# Patient Record
Sex: Male | Born: 1960 | Race: White | Hispanic: No | Marital: Single | Smoking: Current every day smoker
Health system: Southern US, Community
[De-identification: ages and names within clinical notes are randomized; demographics above are authoritative.]

## PROBLEM LIST (undated history)

## (undated) DIAGNOSIS — M1990 Unspecified osteoarthritis, unspecified site: Secondary | ICD-10-CM

## (undated) DIAGNOSIS — I10 Essential (primary) hypertension: Secondary | ICD-10-CM

## (undated) HISTORY — PX: MUSCLE BIOPSY: SHX716

## (undated) HISTORY — PX: SINUS SURGERY WITH INSTATRAK: SHX5215

---

## 2002-08-30 ENCOUNTER — Emergency Department (HOSPITAL_COMMUNITY): Admission: EM | Admit: 2002-08-30 | Discharge: 2002-08-30 | Payer: Self-pay | Admitting: Emergency Medicine

## 2002-08-30 ENCOUNTER — Encounter: Payer: Self-pay | Admitting: Emergency Medicine

## 2002-10-18 ENCOUNTER — Encounter: Admission: RE | Admit: 2002-10-18 | Discharge: 2002-11-22 | Payer: Self-pay | Admitting: Orthopedic Surgery

## 2003-10-13 ENCOUNTER — Emergency Department (HOSPITAL_COMMUNITY): Admission: EM | Admit: 2003-10-13 | Discharge: 2003-10-13 | Payer: Self-pay | Admitting: Emergency Medicine

## 2014-08-25 ENCOUNTER — Encounter (HOSPITAL_COMMUNITY): Payer: Self-pay | Admitting: *Deleted

## 2014-08-25 ENCOUNTER — Emergency Department (HOSPITAL_COMMUNITY): Payer: Self-pay

## 2014-08-25 ENCOUNTER — Emergency Department (HOSPITAL_COMMUNITY)
Admission: EM | Admit: 2014-08-25 | Discharge: 2014-08-25 | Disposition: A | Discharge status: Home / Self Care | Payer: Self-pay | Attending: Emergency Medicine | Admitting: Emergency Medicine

## 2014-08-25 DIAGNOSIS — M79669 Pain in unspecified lower leg: Secondary | ICD-10-CM

## 2014-08-25 DIAGNOSIS — E669 Obesity, unspecified: Secondary | ICD-10-CM | POA: Insufficient documentation

## 2014-08-25 DIAGNOSIS — M1990 Unspecified osteoarthritis, unspecified site: Secondary | ICD-10-CM | POA: Insufficient documentation

## 2014-08-25 DIAGNOSIS — Z720 Tobacco use: Secondary | ICD-10-CM | POA: Insufficient documentation

## 2014-08-25 DIAGNOSIS — I10 Essential (primary) hypertension: Secondary | ICD-10-CM | POA: Insufficient documentation

## 2014-08-25 DIAGNOSIS — M5431 Sciatica, right side: Secondary | ICD-10-CM | POA: Insufficient documentation

## 2014-08-25 HISTORY — DX: Essential (primary) hypertension: I10

## 2014-08-25 HISTORY — DX: Unspecified osteoarthritis, unspecified site: M19.90

## 2014-08-25 LAB — CBC WITH DIFFERENTIAL/PLATELET
Basophils Absolute: 0 10*3/uL (ref 0.0–0.1)
Basophils Relative: 0 % (ref 0–1)
EOS ABS: 0.1 10*3/uL (ref 0.0–0.7)
Eosinophils Relative: 1 % (ref 0–5)
HCT: 42.3 % (ref 39.0–52.0)
Hemoglobin: 14.3 g/dL (ref 13.0–17.0)
Lymphocytes Relative: 29 % (ref 12–46)
Lymphs Abs: 2.7 10*3/uL (ref 0.7–4.0)
MCH: 32.3 pg (ref 26.0–34.0)
MCHC: 33.8 g/dL (ref 30.0–36.0)
MCV: 95.5 fL (ref 78.0–100.0)
MONOS PCT: 6 % (ref 3–12)
Monocytes Absolute: 0.6 10*3/uL (ref 0.1–1.0)
NEUTROS PCT: 64 % (ref 43–77)
Neutro Abs: 5.7 10*3/uL (ref 1.7–7.7)
PLATELETS: 293 10*3/uL (ref 150–400)
RBC: 4.43 MIL/uL (ref 4.22–5.81)
RDW: 14 % (ref 11.5–15.5)
WBC: 9.1 10*3/uL (ref 4.0–10.5)

## 2014-08-25 LAB — BASIC METABOLIC PANEL
Anion gap: 10 (ref 5–15)
BUN: 7 mg/dL (ref 6–20)
CHLORIDE: 105 mmol/L (ref 101–111)
CO2: 23 mmol/L (ref 22–32)
Calcium: 8.8 mg/dL — ABNORMAL LOW (ref 8.9–10.3)
Creatinine, Ser: 0.7 mg/dL (ref 0.61–1.24)
GFR calc Af Amer: >60 mL/min (ref >60)
GLUCOSE: 86 mg/dL (ref 65–99)
POTASSIUM: 3.9 mmol/L (ref 3.5–5.1)
SODIUM: 138 mmol/L (ref 135–145)

## 2014-08-25 LAB — D-DIMER, QUANTITATIVE: D-Dimer, Quant: 0.47 ug/mL-FEU (ref 0.00–0.48)

## 2014-08-25 MED ORDER — SODIUM CHLORIDE 0.9 % IV SOLN: INTRAVENOUS | Status: DC

## 2014-08-25 MED ORDER — HYDROMORPHONE HCL 1 MG/ML IJ SOLN
1.0000 mg | Freq: Once | INTRAMUSCULAR | Status: AC
  Administered 2014-08-25: 1 mg via INTRAVENOUS
  Filled 2014-08-25: qty 1

## 2014-08-25 MED ORDER — PREDNISONE 10 MG (21) PO TBPK: ORAL_TABLET | ORAL | Status: DC

## 2014-08-25 MED ORDER — HYDROCODONE-ACETAMINOPHEN 5-325 MG PO TABS: 1.0000 | ORAL_TABLET | ORAL | Status: DC | PRN

## 2014-08-25 MED ORDER — NAPROXEN 500 MG PO TABS: 500.0000 mg | ORAL_TABLET | Freq: Two times a day (BID) | ORAL | Status: DC

## 2014-08-25 NOTE — ED Provider Notes (Signed)
CSN: [id number]     Arrival date & time 08/25/14  1647 History   First MD Initiated Contact with Patient 08/25/14 1704     Chief Complaint  Patient presents with  . Leg Swelling   HPI Patient presents to the emergency room with complaints of right lower extremity burning and pain. Patient states he started having discomfort in his right lower leg about one week ago. He felt like his leg became swollen. Over that time he started having trouble with a burning sensation of the skin over the entire leg. Today while walking he felt that there was a pop in his knee area. He is having pain in his back as well as the entire right leg. He started having increasing pain. He also felt like his leg turned purple. He denies any trouble with fevers or chills. No history of DVT. No history of circulation problems. Past Medical History  Diagnosis Date  . Hypertension   . Arthritis    Past Surgical History  Procedure Laterality Date  . Muscle biopsy    . Sinus surgery with instatrak     No family history on file. History  Substance Use Topics  . Smoking status: Current Every Day Smoker -- 0.50 packs/day    Types: Cigarettes  . Smokeless tobacco: Not on file  . Alcohol Use: Yes    Review of Systems  All other systems reviewed and are negative.     Allergies  Review of patient's allergies indicates no known allergies.  Home Medications   Prior to Admission medications   Medication Sig Start Date End Date Taking? Authorizing Provider  HYDROcodone-acetaminophen (NORCO/VICODIN) 5-325 MG per tablet Take 1-2 tablets by mouth every 4 (four) hours as needed. 08/25/14   Linwood Dibbles, MD  naproxen (NAPROSYN) 500 MG tablet Take 1 tablet (500 mg total) by mouth 2 (two) times daily. 08/25/14   Linwood Dibbles, MD  predniSONE (STERAPRED UNI-PAK 21 TAB) 10 MG (21) TBPK tablet Take 6 tabs by mouth daily  for 2 days, then 5 tabs for 2 days, then 4 tabs for 2 days, then 3 tabs for 2 days, 2 tabs for 2 days, then 1 tab by  mouth daily for 2 days 08/25/14   Linwood Dibbles, MD   BP 124/73 mmHg  Pulse 79  Temp(Src) 98 F (36.7 C) (Oral)  Resp 18  Ht 5\' 3"  (1.6 m)  Wt 180 lb (81.647 kg)  BMI 31.89 kg/m2  SpO2 94% Physical Exam  Constitutional: No distress.  Obese  HENT:  Head: Normocephalic and atraumatic.  Right Ear: External ear normal.  Left Ear: External ear normal.  Eyes: Conjunctivae are normal. Right eye exhibits no discharge. Left eye exhibits no discharge. No scleral icterus.  Neck: Neck supple. No tracheal deviation present.  Cardiovascular: Normal rate, regular rhythm and intact distal pulses.   Pulmonary/Chest: Effort normal and breath sounds normal. No stridor. No respiratory distress. He has no wheezes. He has no rales.  Abdominal: Soft. Bowel sounds are normal. He exhibits no distension. There is no tenderness. There is no rebound and no guarding.  Protuberant abdomen  Musculoskeletal: He exhibits tenderness. He exhibits no edema.       Lumbar back: He exhibits tenderness.  Strong dorsalis pedis pulse bilaterally lower extremities, sensation to light touch is intact throughout, patient skin is tender to very light touch throughout his lower extremity, no erythema, Refill is brisk, pain with range of motion  Neurological: He is alert. He has normal strength.  No cranial nerve deficit (no facial droop, extraocular movements intact, no slurred speech) or sensory deficit. He exhibits normal muscle tone. He displays no seizure activity. Coordination normal.  Skin: Skin is warm and dry. No rash noted.  Psychiatric: He has a normal mood and affect.  Nursing note and vitals reviewed.   ED Course  Procedures (including critical care time) Labs Review Labs Reviewed  BASIC METABOLIC PANEL - Abnormal; Notable for the following:    Calcium 8.8 (*)    All other components within normal limits  CBC WITH DIFFERENTIAL/PLATELET  D-DIMER, QUANTITATIVE (NOT AT [facility])    Imaging Review Dg Lumbar Spine  Complete  08/25/2014   CLINICAL DATA:  Right leg swelling x1 week, no known injury  EXAM: LUMBAR SPINE - COMPLETE 4+ VIEW  COMPARISON:  None.  FINDINGS: Five lumbar type vertebral bodies.  Normal lumbar lordosis.  No evidence of fracture or dislocation. Vertebral body heights are maintained.  Mild multilevel degenerative changes, most prominent at L5-S1.  Visualized bony pelvis appears intact.  Degenerative changes of the bilateral hips.  IMPRESSION: No fracture or dislocation is seen.  Mild multilevel degenerative changes.   Electronically Signed   By: Charline Bills M.D.   On: 08/25/2014 18:11   US Venous Img Lower Unilateral Right  08/25/2014   CLINICAL DATA:  Right leg swelling.  EXAM: RIGHT LOWER EXTREMITY VENOUS DOPPLER ULTRASOUND  TECHNIQUE: Gray-scale sonography with graded compression, as well as color Doppler and duplex ultrasound were performed to evaluate the lower extremity deep venous systems from the level of the common femoral vein and including the common femoral, femoral, profunda femoral, popliteal and calf veins including the posterior tibial, peroneal and gastrocnemius veins when visible. The superficial great saphenous vein was also interrogated. Spectral Doppler was utilized to evaluate flow at rest and with distal augmentation maneuvers in the common femoral, femoral and popliteal veins.  COMPARISON:  None.  FINDINGS: Contralateral Common Femoral Vein: Respiratory phasicity is normal and symmetric with the symptomatic side. No evidence of thrombus. Normal compressibility.  Common Femoral Vein: No evidence of thrombus. Normal compressibility, respiratory phasicity and response to augmentation.  Saphenofemoral Junction: No evidence of thrombus. Normal compressibility and flow on color Doppler imaging.  Profunda Femoral Vein: No evidence of thrombus. Normal compressibility and flow on color Doppler imaging.  Femoral Vein: No evidence of thrombus. Normal compressibility, respiratory  phasicity and response to augmentation.  Popliteal Vein: No evidence of thrombus. Normal compressibility, respiratory phasicity and response to augmentation.  Calf Veins: No evidence of thrombus. Normal compressibility and flow on color Doppler imaging.  Superficial Great Saphenous Vein: No evidence of thrombus. Normal compressibility and flow on color Doppler imaging.  Venous Reflux:  None.  Other Findings:  None.  IMPRESSION: No evidence of deep venous thrombosis.   Electronically Signed   By: Francene Boyers M.D.   On: 08/25/2014 18:30      MDM   Final diagnoses:  Sciatica, right    No sign of acute neurological or vascular emergency.  No dvt.  Normal arterial pulses.   Doubt infection.  May have a component of sciatica.  Safe for outpatient follow up.     Linwood Dibbles, MD 08/25/14 Mikle Bosworth

## 2014-08-25 NOTE — ED Notes (Signed)
Pt in xray

## 2014-08-25 NOTE — ED Notes (Signed)
RLE swelling and pain from hip down x 1 week, per EMS. No known injury. Pt was waking today and felt a pop to the knee. Pt states pain to entire leg, even with light touch.

## 2014-08-25 NOTE — ED Notes (Signed)
Pt placed on 2liters, oxygen saturation 94%.. Room air oxygen saturation 91%. nad noted.

## 2014-08-25 NOTE — Discharge Instructions (Signed)
Sciatica Sciatica is pain, weakness, numbness, or tingling along the path of the sciatic nerve. The nerve starts in the lower back and runs down the back of each leg. The nerve controls the muscles in the lower leg and in the back of the knee, while also providing sensation to the back of the thigh, lower leg, and the sole of your foot. Sciatica is a symptom of another medical condition. For instance, nerve damage or certain conditions, such as a herniated disk or bone spur on the spine, pinch or put pressure on the sciatic nerve. This causes the pain, weakness, or other sensations normally associated with sciatica. Generally, sciatica only affects one side of the body. CAUSES   Herniated or slipped disc.  Degenerative disk disease.  A pain disorder involving the narrow muscle in the buttocks (piriformis syndrome).  Pelvic injury or fracture.  Pregnancy.  Tumor (rare). SYMPTOMS  Symptoms can vary from mild to very severe. The symptoms usually travel from the low back to the buttocks and down the back of the leg. Symptoms can include:  Mild tingling or dull aches in the lower back, leg, or hip.  Numbness in the back of the calf or sole of the foot.  Burning sensations in the lower back, leg, or hip.  Sharp pains in the lower back, leg, or hip.  Leg weakness.  Severe back pain inhibiting movement. These symptoms may get worse with coughing, sneezing, laughing, or prolonged sitting or standing. Also, being overweight may worsen symptoms. DIAGNOSIS  Your caregiver will perform a physical exam to look for common symptoms of sciatica. He or she may ask you to do certain movements or activities that would trigger sciatic nerve pain. Other tests may be performed to find the cause of the sciatica. These may include:  Blood tests.  X-rays.  Imaging tests, such as an MRI or CT scan. TREATMENT  Treatment is directed at the cause of the sciatic pain. Sometimes, treatment is not necessary  and the pain and discomfort goes away on its own. If treatment is needed, your caregiver may suggest:  Over-the-counter medicines to relieve pain.  Prescription medicines, such as anti-inflammatory medicine, muscle relaxants, or narcotics.  Applying heat or ice to the painful area.  Steroid injections to lessen pain, irritation, and inflammation around the nerve.  Reducing activity during periods of pain.  Exercising and stretching to strengthen your abdomen and improve flexibility of your spine. Your caregiver may suggest losing weight if the extra weight makes the back pain worse.  Physical therapy.  Surgery to eliminate what is pressing or pinching the nerve, such as a bone spur or part of a herniated disk. HOME CARE INSTRUCTIONS   Only take over-the-counter or prescription medicines for pain or discomfort as directed by your caregiver.  Apply ice to the affected area for 20 minutes, 3-4 times a day for the first 48-72 hours. Then try heat in the same way.  Exercise, stretch, or perform your usual activities if these do not aggravate your pain.  Attend physical therapy sessions as directed by your caregiver.  Keep all follow-up appointments as directed by your caregiver.  Do not wear high heels or shoes that do not provide proper support.  Check your mattress to see if it is too soft. A firm mattress may lessen your pain and discomfort. SEEK IMMEDIATE MEDICAL CARE IF:   You lose control of your bowel or bladder (incontinence).  You have increasing weakness in the lower back, pelvis, buttocks,   or legs.  You have redness or swelling of your back.  You have a burning sensation when you urinate.  You have pain that gets worse when you lie down or awakens you at night.  Your pain is worse than you have experienced in the past.  Your pain is lasting longer than 4 weeks.  You are suddenly losing weight without reason. MAKE SURE YOU:  Understand these  instructions.  Will watch your condition.  Will get help right away if you are not doing well or get worse. Document Released: 02/11/2001 Document Revised: 08/19/2011 Document Reviewed: 06/29/2011 ExitCare Patient Information 2015 [employer]. This information is not intended to replace advice given to you by your health care provider. Make sure you discuss any questions you have with your health care provider.  

## 2015-10-02 DIAGNOSIS — Z139 Encounter for screening, unspecified: Secondary | ICD-10-CM

## 2015-10-08 ENCOUNTER — Ambulatory Visit: Payer: Self-pay | Admitting: Physician Assistant

## 2015-10-08 ENCOUNTER — Encounter: Payer: Self-pay | Admitting: Physician Assistant

## 2015-10-08 VITALS — BP 160/90 | HR 84 | Temp 97.9°F | Ht 66.5 in | Wt 288.0 lb

## 2015-10-08 DIAGNOSIS — M5442 Lumbago with sciatica, left side: Principal | ICD-10-CM

## 2015-10-08 DIAGNOSIS — F329 Major depressive disorder, single episode, unspecified: Secondary | ICD-10-CM

## 2015-10-08 DIAGNOSIS — F17210 Nicotine dependence, cigarettes, uncomplicated: Secondary | ICD-10-CM | POA: Insufficient documentation

## 2015-10-08 DIAGNOSIS — F1011 Alcohol abuse, in remission: Secondary | ICD-10-CM | POA: Insufficient documentation

## 2015-10-08 DIAGNOSIS — F32A Depression, unspecified: Secondary | ICD-10-CM | POA: Insufficient documentation

## 2015-10-08 DIAGNOSIS — M545 Low back pain, unspecified: Secondary | ICD-10-CM | POA: Insufficient documentation

## 2015-10-08 DIAGNOSIS — I10 Essential (primary) hypertension: Secondary | ICD-10-CM

## 2015-10-08 DIAGNOSIS — Z13220 Encounter for screening for lipoid disorders: Secondary | ICD-10-CM

## 2015-10-08 DIAGNOSIS — M5441 Lumbago with sciatica, right side: Secondary | ICD-10-CM

## 2015-10-08 DIAGNOSIS — Z125 Encounter for screening for malignant neoplasm of prostate: Secondary | ICD-10-CM

## 2015-10-08 DIAGNOSIS — Z09 Encounter for follow-up examination after completed treatment for conditions other than malignant neoplasm: Secondary | ICD-10-CM

## 2015-10-08 DIAGNOSIS — Z131 Encounter for screening for diabetes mellitus: Secondary | ICD-10-CM

## 2015-10-08 LAB — GLUCOSE, POCT (MANUAL RESULT ENTRY): POC GLUCOSE: 102 mg/dL — AB (ref 70–99)

## 2015-10-08 MED ORDER — CYCLOBENZAPRINE HCL 10 MG PO TABS: 10.0000 mg | ORAL_TABLET | Freq: Three times a day (TID) | ORAL | 0 refills | Status: DC | PRN

## 2015-10-08 MED ORDER — LISINOPRIL 10 MG PO TABS: 10.0000 mg | ORAL_TABLET | Freq: Every day | ORAL | 1 refills | Status: DC

## 2015-10-08 MED ORDER — PREDNISONE 10 MG PO TABS: ORAL_TABLET | ORAL | 0 refills | Status: DC

## 2015-10-08 NOTE — Progress Notes (Signed)
BP (!) 160/90 (BP Location: Left Arm, Patient Position: Sitting, Cuff Size: Large)   Pulse 84   Temp 97.9 F (36.6 C) (Axillary)   Ht 5' 6.5" (1.689 m)   Wt 288 lb (130.6 kg)   SpO2 95%   BMI 45.79 kg/m    Subjective:    Patient ID: Timothy Herman, male    DOB: Jun 08, 1960, 55 y.o.   MRN: [id number]  HPI: Timothy Herman is a 55 y.o. male presenting on 10/08/2015 for New Patient (Initial Visit)   HPI   Pt hasn't had any medical care. He says last time he saw anyone was in ER in 2016.  Pt says he took meds for HTN years and years ago in [place].   Pt smokes.  Hx etoh- none in about 1 1/2 year.  Pt has no family here.  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  Pain radiates into both legs.  He says they "give out" at times.  Pt states this all started a year ago when he slipped on the ice.  This is why he went to the ER in 2016.  Pt went to ER in June.  He says he was fine for 6 months after slipping before his pain started.  Explained to pt that the ice wasn't the cause of his pain if he was good for so many months before onset symtpoms.    Pt is poor historian- he often states something that he just finished saying something completely the opposite- ie I asked if either of his parents were still alive and he said no.  When I asked what did they pass from he told me about his mother and then said "but my dad is still alive".   He did this at least six times during today's office visit.   Asked pt about 100 pound weight gain in the past year and he was not surprised, said that he hadn't been very active in the past year  Relevant past medical, surgical, family and social history reviewed and updated as indicated. Interim medical history since our last visit reviewed. Allergies and medications reviewed and updated.   No current outpatient prescriptions on file.  Review of Systems  Constitutional:  Positive for fatigue. Negative for appetite change, chills, diaphoresis, fever and unexpected weight change.  HENT: Positive for dental problem, mouth sores and sneezing. Negative for congestion, drooling, ear pain, facial swelling, hearing loss, sore throat, trouble swallowing and voice change.   Eyes: Positive for discharge and itching. Negative for pain, redness and visual disturbance.  Respiratory: Negative for cough, choking, shortness of breath and wheezing.   Cardiovascular: Positive for leg swelling. Negative for chest pain and palpitations.  Gastrointestinal: Negative for abdominal pain, blood in stool, constipation, diarrhea and vomiting.  Endocrine: Negative for cold intolerance, heat intolerance and polydipsia.  Genitourinary: Negative for decreased urine volume, dysuria and hematuria.  Musculoskeletal: Positive for arthralgias, back pain and gait problem.  Skin: Negative for rash.  Allergic/Immunologic: Positive for environmental allergies.  Neurological: Positive for headaches. Negative for seizures, syncope and light-headedness.  Hematological: Negative for adenopathy.  Psychiatric/Behavioral: Positive for agitation and dysphoric mood. Negative for suicidal ideas. The patient is nervous/anxious.     Per HPI unless specifically indicated above     Objective:    BP (!) 160/90 (BP Location: Left Arm, Patient Position: Sitting, Cuff Size:  Large)   Pulse 84   Temp 97.9 F (36.6 C) (Axillary)   Ht 5' 6.5" (1.689 m)   Wt 288 lb (130.6 kg)   SpO2 95%   BMI 45.79 kg/m   Wt Readings from Last 3 Encounters:  10/08/15 288 lb (130.6 kg)  08/25/14 180 lb (81.6 kg)    Physical Exam  Constitutional: He is oriented to person, place, and time. He appears well-developed and well-nourished.  HENT:  Head: Normocephalic and atraumatic.  Mouth/Throat: Oropharynx is clear and moist. No oropharyngeal exudate.  Eyes: Conjunctivae and EOM are normal. Pupils are equal, round, and reactive  to light.  Neck: Neck supple. No thyromegaly present.  Cardiovascular: Normal rate and regular rhythm.   Pulmonary/Chest: Effort normal and breath sounds normal. He has no wheezes. He has no rales.  Abdominal: Soft. Bowel sounds are normal. He exhibits no mass. There is no hepatosplenomegaly. There is no tenderness.  Musculoskeletal: He exhibits no edema.       Lumbar back: He exhibits decreased range of motion and tenderness. He exhibits no bony tenderness, no swelling, no edema and no deformity.  + SLR about 20 degrees BLE  Lymphadenopathy:    He has no cervical adenopathy.  Neurological: He is alert and oriented to person, place, and time.  Skin: Skin is warm and dry. No rash noted.  Vitals reviewed.   Results for orders placed or performed in visit on 10/08/15  POCT Glucose (CBG)  Result Value Ref Range   POC Glucose 102 (A) 70 - 99 mg/dl      Assessment & Plan:   Encounter Diagnoses  Name Primary?  . Bilateral low back pain with sciatica, sciatica laterality unspecified Yes  . Essential hypertension, benign   . Morbid obesity, unspecified obesity type (HCC)   . Alcohol abuse, in remission   . Cigarette nicotine dependence without complication   . Depression   . Screening for diabetes mellitus   . Screening cholesterol level   . Screening for prostate cancer     -urged [facility] to [facility]. Discussed that they have walk-in five days/week -get baseline fasting labs -gave pt [facility] discount application- Possible Orthopedics referral versus mri at next OV if not improved -rx Prednisone and flexeril for back pain -F/u 1 month.  RTO sooner prn worsening or new symptoms

## 2015-10-08 NOTE — Patient Instructions (Signed)
Get blood drawn Turn in [facility] application [facility] for mood Medications as directed

## 2015-10-22 LAB — COMPLETE METABOLIC PANEL WITH GFR
ALBUMIN: 4.2 g/dL (ref 3.6–5.1)
ALK PHOS: 49 U/L (ref 40–115)
ALT: 45 U/L (ref 9–46)
AST: 19 U/L (ref 10–35)
BUN: 12 mg/dL (ref 7–25)
CALCIUM: 9.4 mg/dL (ref 8.6–10.3)
CO2: 23 mmol/L (ref 20–31)
CREATININE: 0.85 mg/dL (ref 0.70–1.33)
Chloride: 104 mmol/L (ref 98–110)
GFR, Est African American: >89 mL/min (ref >=60)
GFR, Est Non African American: >89 mL/min (ref >=60)
GLUCOSE: 107 mg/dL — AB (ref 65–99)
Potassium: 4.5 mmol/L (ref 3.5–5.3)
SODIUM: 137 mmol/L (ref 135–146)
TOTAL PROTEIN: 7.2 g/dL (ref 6.1–8.1)
Total Bilirubin: 0.4 mg/dL (ref 0.2–1.2)

## 2015-10-22 LAB — LIPID PANEL
CHOLESTEROL: 152 mg/dL (ref 125–200)
HDL: 44 mg/dL (ref >=40)
LDL Cholesterol: 89 mg/dL (ref <130)
Total CHOL/HDL Ratio: 3.5 Ratio (ref <=5.0)
Triglycerides: 94 mg/dL (ref <150)
VLDL: 19 mg/dL (ref <30)

## 2015-10-23 LAB — PSA: PSA: 0.7 ng/mL (ref <=4.0)

## 2015-10-23 LAB — HEMOGLOBIN A1C
HEMOGLOBIN A1C: 6.5 % — AB (ref <5.7)
MEAN PLASMA GLUCOSE: 140 mg/dL

## 2015-11-07 NOTE — Congregational Nurse Program (Signed)
Congregational Nurse Program Note  Date of Encounter: 10/08/2015  Past Medical History: Past Medical History:  Diagnosis Date  . Arthritis   . Hypertension     Encounter Details:   Follow up [facility] . Client is in need of assistance acquiring his prescriptions. [facility] will do a one time assistance. Referral form filled out and sent by Hedda SladePatricia Settle RN to [facility]. Will follow client as needed for further needs.  Client is to notify RN of transportation needs for Medical appointments. Client is in process of being approved for [facility]. Forms sent in by staff of the [facility].
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
## 2015-11-08 ENCOUNTER — Encounter: Payer: Self-pay | Admitting: Physician Assistant

## 2015-11-08 ENCOUNTER — Ambulatory Visit: Payer: Self-pay | Admitting: Physician Assistant

## 2015-11-08 VITALS — BP 158/88 | HR 95 | Temp 98.1°F | Ht 66.5 in | Wt 278.6 lb

## 2015-11-08 DIAGNOSIS — M5441 Lumbago with sciatica, right side: Secondary | ICD-10-CM

## 2015-11-08 DIAGNOSIS — M5442 Lumbago with sciatica, left side: Secondary | ICD-10-CM

## 2015-11-08 DIAGNOSIS — I10 Essential (primary) hypertension: Secondary | ICD-10-CM

## 2015-11-08 DIAGNOSIS — E119 Type 2 diabetes mellitus without complications: Secondary | ICD-10-CM | POA: Insufficient documentation

## 2015-11-08 DIAGNOSIS — F329 Major depressive disorder, single episode, unspecified: Secondary | ICD-10-CM

## 2015-11-08 DIAGNOSIS — F32A Depression, unspecified: Secondary | ICD-10-CM

## 2015-11-08 DIAGNOSIS — F17210 Nicotine dependence, cigarettes, uncomplicated: Secondary | ICD-10-CM

## 2015-11-08 MED ORDER — LISINOPRIL 20 MG PO TABS: 20.0000 mg | ORAL_TABLET | Freq: Every day | ORAL | 1 refills | Status: DC

## 2015-11-08 MED ORDER — METOPROLOL TARTRATE 50 MG PO TABS: 50.0000 mg | ORAL_TABLET | Freq: Two times a day (BID) | ORAL | 1 refills | Status: DC

## 2015-11-08 NOTE — Progress Notes (Signed)
BP (!) 158/88 (BP Location: Left Arm, Patient Position: Sitting, Cuff Size: Large) Comment (Cuff Size): Thigh cuff  Pulse 95   Temp 98.1 F (36.7 C)   Ht 5' 6.5" (1.689 m)   Wt 278 lb 9.6 oz (126.4 kg)   SpO2 95%   BMI 44.29 kg/m    Subjective:    Patient ID: Timothy Herman, male    DOB: 05-21-1960, 55 y.o.   MRN: [id number]  HPI: Timothy Herman is a 55 y.o. male presenting on 11/08/2015 for Hypertension and Back Pain   HPI Pt took his last lisinopril this morning.  Pt states "little bit" of improvement in back symptoms with prednisone taper and flexeril.  Pt seen at [facility] yesterday by dr Geanie Cooley.  He gave 2 Rx for but pt states he can't afford them- celexa and trazodone- even though they are both on the $4 list at [facility]  Pt took his cone discount application but Morrie Sheldon was out of office so he left it at the front desk.  Relevant past medical, surgical, family and social history reviewed and updated as indicated. Interim medical history since our last visit reviewed. Allergies and medications reviewed and updated.   Current Outpatient Prescriptions:  .  lisinopril (PRINIVIL,ZESTRIL) 10 MG tablet, Take 1 tablet (10 mg total) by mouth daily., Disp: 30 tablet, Rfl: 1  Review of Systems  Constitutional: Positive for fatigue. Negative for appetite change, chills, diaphoresis, fever and unexpected weight change.  HENT: Positive for congestion. Negative for drooling, ear pain, facial swelling, hearing loss, mouth sores, sneezing, sore throat, trouble swallowing and voice change.   Eyes: Positive for discharge. Negative for pain, redness, itching and visual disturbance.  Respiratory: Positive for cough. Negative for choking, shortness of breath and wheezing.   Cardiovascular: Negative for chest pain, palpitations and leg swelling.  Gastrointestinal: Negative for abdominal pain, blood in stool, constipation, diarrhea and vomiting.  Endocrine: Negative for cold intolerance, heat  intolerance and polydipsia.  Genitourinary: Negative for decreased urine volume, dysuria and hematuria.  Musculoskeletal: Positive for arthralgias, back pain and gait problem.  Skin: Negative for rash.  Allergic/Immunologic: Negative for environmental allergies.  Neurological: Negative for seizures, syncope, light-headedness and headaches.  Hematological: Negative for adenopathy.  Psychiatric/Behavioral: Positive for dysphoric mood. Negative for agitation and suicidal ideas. The patient is nervous/anxious.     Per HPI unless specifically indicated above     Objective:    BP (!) 158/88 (BP Location: Left Arm, Patient Position: Sitting, Cuff Size: Large) Comment (Cuff Size): Thigh cuff  Pulse 95   Temp 98.1 F (36.7 C)   Ht 5' 6.5" (1.689 m)   Wt 278 lb 9.6 oz (126.4 kg)   SpO2 95%   BMI 44.29 kg/m   Wt Readings from Last 3 Encounters:  11/08/15 278 lb 9.6 oz (126.4 kg)  10/08/15 288 lb (130.6 kg)  08/25/14 180 lb (81.6 kg)    Physical Exam  Constitutional: He is oriented to person, place, and time. He appears well-developed and well-nourished.  HENT:  Head: Normocephalic and atraumatic.  Neck: Neck supple.  Cardiovascular: Normal rate and regular rhythm.   Pulmonary/Chest: Effort normal and breath sounds normal. He has no wheezes.  Abdominal: Soft. Bowel sounds are normal. There is no hepatosplenomegaly. There is no tenderness.  Musculoskeletal: He exhibits no edema.       Lumbar back: He exhibits decreased range of motion and tenderness.  Lymphadenopathy:    He has no cervical adenopathy.  Neurological: He is alert  and oriented to person, place, and time.  Skin: Skin is warm and dry.  Psychiatric: He has a normal mood and affect. His behavior is normal.  Vitals reviewed.   Results for orders placed or performed in visit on 10/08/15  COMPLETE METABOLIC PANEL WITH GFR  Result Value Ref Range   Sodium 137 135 - 146 mmol/L   Potassium 4.5 3.5 - 5.3 mmol/L   Chloride  104 98 - 110 mmol/L   CO2 23 20 - 31 mmol/L   Glucose, Bld 107 (H) 65 - 99 mg/dL   BUN 12 7 - 25 mg/dL   Creat 0.450.85 4.090.70 - 8.111.33 mg/dL   Total Bilirubin 0.4 0.2 - 1.2 mg/dL   Alkaline Phosphatase 49 40 - 115 U/L   AST 19 10 - 35 U/L   ALT 45 9 - 46 U/L   Total Protein 7.2 6.1 - 8.1 g/dL   Albumin 4.2 3.6 - 5.1 g/dL   Calcium 9.4 8.6 - 91.410.3 mg/dL   GFR, Est African American >89 >=60 mL/min   GFR, Est Non African American >89 >=60 mL/min  Lipid Profile  Result Value Ref Range   Cholesterol 152 125 - 200 mg/dL   Triglycerides 94 <782<150 mg/dL   HDL 44 >=95>=40 mg/dL   Total CHOL/HDL Ratio 3.5 <=5.0 Ratio   VLDL 19 <30 mg/dL   LDL Cholesterol 89 <621<130 mg/dL  HgB H0QA1c  Result Value Ref Range   Hgb A1c MFr Bld 6.5 (H) <5.7 %   Mean Plasma Glucose 140 mg/dL  PSA  Result Value Ref Range   PSA 0.7 <=4.0 ng/mL  POCT Glucose (CBG)  Result Value Ref Range   POC Glucose 102 (A) 70 - 99 mg/dl      Assessment & Plan:   Encounter Diagnoses  Name Primary?  . Type 2 diabetes mellitus without complication, unspecified long term insulin use status (HCC) Yes  . Essential hypertension, benign   . Bilateral low back pain with sciatica, sciatica laterality unspecified   . Cigarette nicotine dependence without complication   . Morbid obesity, unspecified obesity type (HCC)   . Depression     -reviewed labs with pt -counseled smoking cessation -rx lisinopril 20mg  qd and metoprolol 50mg  bid -sign up for [facility]ist. Gave rx to get filled until meds come in the mail.  Encouraged pt to use his limited $$ on getting his Rx and not on more cigarettes. -pt to go to dm education class -Refer to orthopedics for back pain. Pt says he turned in his cone discount application -will start glycemic medication when he gets signed up for medassist -f/u 1 month.  RTO sooner prn

## 2015-11-08 NOTE — Patient Instructions (Signed)
Diabetes Mellitus and Food It is important for you to manage your blood sugar (glucose) level. Your blood glucose level can be greatly affected by what you eat. Eating healthier foods in the appropriate amounts throughout the day at about the same time each day will help you control your blood glucose level. It can also help slow or prevent worsening of your diabetes mellitus. Healthy eating may even help you improve the level of your blood pressure and reach or maintain a healthy weight.  General recommendations for healthful eating and cooking habits include:  Eating meals and snacks regularly. Avoid going long periods of time without eating to lose weight.  Eating a diet that consists mainly of plant-based foods, such as fruits, vegetables, nuts, legumes, and whole grains.  Using low-heat cooking methods, such as baking, instead of high-heat cooking methods, such as deep frying. Work with your dietitian to make sure you understand how to use the Nutrition Facts information on food labels. HOW CAN FOOD AFFECT ME? Carbohydrates Carbohydrates affect your blood glucose level more than any other type of food. Your dietitian will help you determine how many carbohydrates to eat at each meal and teach you how to count carbohydrates. Counting carbohydrates is important to keep your blood glucose at a healthy level, especially if you are using insulin or taking certain medicines for diabetes mellitus. Alcohol Alcohol can cause sudden decreases in blood glucose (hypoglycemia), especially if you use insulin or take certain medicines for diabetes mellitus. Hypoglycemia can be a life-threatening condition. Symptoms of hypoglycemia (sleepiness, dizziness, and disorientation) are similar to symptoms of having too much alcohol.  If your health care provider has given you approval to drink alcohol, do so in moderation and use the following guidelines:  Women should not have more than one drink per day, and men  should not have more than two drinks per day. One drink is equal to:  12 oz of beer.  5 oz of wine.  1 oz of hard liquor.  Do not drink on an empty stomach.  Keep yourself hydrated. Have water, diet soda, or unsweetened iced tea.  Regular soda, juice, and other mixers might contain a lot of carbohydrates and should be counted. WHAT FOODS ARE NOT RECOMMENDED? As you make food choices, it is important to remember that all foods are not the same. Some foods have fewer nutrients per serving than other foods, even though they might have the same number of calories or carbohydrates. It is difficult to get your body what it needs when you eat foods with fewer nutrients. Examples of foods that you should avoid that are high in calories and carbohydrates but low in nutrients include:  Trans fats (most processed foods list trans fats on the Nutrition Facts label).  Regular soda.  Juice.  Candy.  Sweets, such as cake, pie, doughnuts, and cookies.  Fried foods. WHAT FOODS CAN I EAT? Eat nutrient-rich foods, which will nourish your body and keep you healthy. The food you should eat also will depend on several factors, including:  The calories you need.  The medicines you take.  Your weight.  Your blood glucose level.  Your blood pressure level.  Your cholesterol level. You should eat a variety of foods, including:  Protein.  Lean cuts of meat.  Proteins low in saturated fats, such as fish, egg whites, and beans. Avoid processed meats.  Fruits and vegetables.  Fruits and vegetables that may help control blood glucose levels, such as apples, mangoes, and   yams.  Dairy products.  Choose fat-free or low-fat dairy products, such as milk, yogurt, and cheese.  Grains, bread, pasta, and rice.  Choose whole grain products, such as multigrain bread, whole oats, and brown rice. These foods may help control blood pressure.  Fats.  Foods containing healthful fats, such as nuts,  avocado, olive oil, canola oil, and fish. DOES EVERYONE WITH DIABETES MELLITUS HAVE THE SAME MEAL PLAN? Because every person with diabetes mellitus is different, there is not one meal plan that works for everyone. It is very important that you meet with a dietitian who will help you create a meal plan that is just right for you.   This information is not intended to replace advice given to you by your health care provider. Make sure you discuss any questions you have with your health care provider.   Document Released: 11/14/2004 Document Revised: 03/10/2014 Document Reviewed: 01/14/2013 Elsevier Interactive Patient Education 2016 Elsevier Inc.  

## 2015-11-12 ENCOUNTER — Other Ambulatory Visit: Payer: Self-pay | Admitting: Physician Assistant

## 2015-11-12 MED ORDER — METOPROLOL TARTRATE 50 MG PO TABS: 50.0000 mg | ORAL_TABLET | Freq: Two times a day (BID) | ORAL | 1 refills | Status: DC

## 2015-11-12 MED ORDER — LISINOPRIL 20 MG PO TABS: 20.0000 mg | ORAL_TABLET | Freq: Every day | ORAL | 1 refills | Status: DC

## 2015-12-06 ENCOUNTER — Encounter: Payer: Self-pay | Admitting: Physician Assistant

## 2015-12-06 ENCOUNTER — Ambulatory Visit: Payer: Self-pay | Admitting: Physician Assistant

## 2015-12-06 VITALS — BP 124/70 | HR 67 | Temp 98.1°F | Ht 66.5 in | Wt 268.0 lb

## 2015-12-06 DIAGNOSIS — M5440 Lumbago with sciatica, unspecified side: Secondary | ICD-10-CM

## 2015-12-06 DIAGNOSIS — G8929 Other chronic pain: Secondary | ICD-10-CM

## 2015-12-06 DIAGNOSIS — I10 Essential (primary) hypertension: Secondary | ICD-10-CM

## 2015-12-06 DIAGNOSIS — E119 Type 2 diabetes mellitus without complications: Secondary | ICD-10-CM

## 2015-12-06 DIAGNOSIS — F329 Major depressive disorder, single episode, unspecified: Secondary | ICD-10-CM

## 2015-12-06 DIAGNOSIS — F17210 Nicotine dependence, cigarettes, uncomplicated: Secondary | ICD-10-CM

## 2015-12-06 DIAGNOSIS — F32A Depression, unspecified: Secondary | ICD-10-CM

## 2015-12-06 MED ORDER — METFORMIN HCL ER (MOD) 500 MG PO TB24: 500.0000 mg | ORAL_TABLET | Freq: Every day | ORAL | 1 refills | Status: DC

## 2015-12-06 NOTE — Progress Notes (Signed)
BP 124/70 (BP Location: Left Arm, Patient Position: Sitting, Cuff Size: Large)   Pulse 67   Temp 98.1 F (36.7 C)   Ht 5' 6.5" (1.689 m)   Wt 268 lb (121.6 kg)   SpO2 95%   BMI 42.61 kg/m    Subjective:    Patient ID: Timothy Herman, male    DOB: 09/15/1960, 55 y.o.   MRN: [id number]  HPI: Timothy BumpJames Eblin is a 55 y.o. male presenting on 12/06/2015 for Diabetes; Hypertension; and Back Pain   HPI   Pt went to DM education class  Pt just today brought in the papers needed to get him on med[facility]ist.   [facility]   Pt says he has heard nothing on his cone discount application. He says he turned it in over a month ago.  He says it was put in [employer]'s mailbox b/c she was on vacation.   Pt is still  Smoking.  He says he has cut back some.   Relevant past medical, surgical, family and social history reviewed and updated as indicated. Interim medical history since our last visit reviewed. Allergies and medications reviewed and updated.   Current Outpatient Prescriptions:  .  citalopram (CELEXA) 20 MG tablet, Take 20 mg by mouth daily., Disp: , Rfl:  .  lisinopril (PRINIVIL,ZESTRIL) 20 MG tablet, Take 1 tablet (20 mg total) by mouth daily., Disp: 90 tablet, Rfl: 1 .  metoprolol (LOPRESSOR) 50 MG tablet, Take 1 tablet (50 mg total) by mouth 2 (two) times daily., Disp: 180 tablet, Rfl: 1 .  traZODone (DESYREL) 100 MG tablet, Take 100 mg by mouth at bedtime., Disp: , Rfl:    Review of Systems  Constitutional: Positive for diaphoresis and fatigue. Negative for appetite change, chills, fever and unexpected weight change.  HENT: Positive for congestion and sneezing. Negative for drooling, ear pain, facial swelling, hearing loss, mouth sores, sore throat, trouble swallowing and voice change.   Eyes: Positive for discharge and itching. Negative for pain, redness and visual disturbance.  Respiratory: Positive for shortness of breath and wheezing. Negative for cough and  choking.   Cardiovascular: Positive for leg swelling. Negative for chest pain and palpitations.  Gastrointestinal: Negative for abdominal pain, blood in stool, constipation, diarrhea and vomiting.  Endocrine: Positive for cold intolerance and polydipsia. Negative for heat intolerance.  Genitourinary: Negative for decreased urine volume, dysuria and hematuria.  Musculoskeletal: Positive for arthralgias, back pain and gait problem.  Skin: Negative for rash.  Allergic/Immunologic: Positive for environmental allergies.  Neurological: Positive for light-headedness. Negative for seizures, syncope and headaches.  Hematological: Negative for adenopathy.  Psychiatric/Behavioral: Negative for agitation, dysphoric mood and suicidal ideas. The patient is not nervous/anxious.     Per HPI unless specifically indicated above     Objective:    BP 124/70 (BP Location: Left Arm, Patient Position: Sitting, Cuff Size: Large)   Pulse 67   Temp 98.1 F (36.7 C)   Ht 5' 6.5" (1.689 m)   Wt 268 lb (121.6 kg)   SpO2 95%   BMI 42.61 kg/m   Wt Readings from Last 3 Encounters:  12/06/15 268 lb (121.6 kg)  11/08/15 278 lb 9.6 oz (126.4 kg)  10/08/15 288 lb (130.6 kg)    Physical Exam  Constitutional: He is oriented to person, place, and time. He appears well-developed and well-nourished.  HENT:  Head: Normocephalic and atraumatic.  Neck: Neck supple.  Cardiovascular: Normal rate and regular rhythm.   Pulmonary/Chest: Effort normal  and breath sounds normal. He has no wheezes.  Abdominal: Soft. Bowel sounds are normal. There is no hepatosplenomegaly. There is no tenderness.  Musculoskeletal: He exhibits no edema.  Lymphadenopathy:    He has no cervical adenopathy.  Neurological: He is alert and oriented to person, place, and time.  Skin: Skin is warm and dry.  Psychiatric: He has a normal mood and affect. His behavior is normal.  Vitals reviewed.       Assessment & Plan:    Encounter Diagnoses   Name Primary?  . Essential hypertension, benign Yes  . Type 2 diabetes mellitus without complication, unspecified long term insulin use status (HCC)   . Chronic bilateral low back pain with sciatica, sciatica laterality unspecified   . Morbid obesity (HCC)   . Cigarette nicotine dependence without complication   . Depression, unspecified depression type     -Pt to continue on lisinopril and metoprolol for blood pressure -Add metformin for diabetes -nurse Manuella Ghazi called and spoke with Morrie Sheldon.  She says she never got application from mr [facility].  Pt is given another application and encouraged to give it directly to [facility] -referral has been made for pt to go to orthopedics for his back pain. Scheduling awaiting approval of cone discount -follow up 6 weeks to check pt's tolerance to metformin, recheck his bp and make sure he has been scheduled with orthopedics.  RTO sooner prn

## 2015-12-10 ENCOUNTER — Other Ambulatory Visit: Payer: Self-pay | Admitting: Physician Assistant

## 2015-12-10 DIAGNOSIS — G8929 Other chronic pain: Secondary | ICD-10-CM

## 2015-12-10 DIAGNOSIS — M5440 Lumbago with sciatica, unspecified side: Principal | ICD-10-CM

## 2016-01-03 ENCOUNTER — Telehealth: Payer: Self-pay

## 2016-01-03 NOTE — Telephone Encounter (Signed)
Client called in regards to [facility] transportation [facility] 1030 am. Client made aware of pickup time . Client states he is doing okay for now and will call RN for any further needs.

## 2016-01-16 ENCOUNTER — Other Ambulatory Visit: Payer: Self-pay | Admitting: Physician Assistant

## 2016-01-16 ENCOUNTER — Ambulatory Visit: Payer: Self-pay | Admitting: Physician Assistant

## 2016-01-16 ENCOUNTER — Encounter: Payer: Self-pay | Admitting: Physician Assistant

## 2016-01-16 VITALS — BP 144/80 | HR 71 | Temp 98.1°F | Ht 66.5 in | Wt 254.6 lb

## 2016-01-16 DIAGNOSIS — Z1211 Encounter for screening for malignant neoplasm of colon: Secondary | ICD-10-CM

## 2016-01-16 DIAGNOSIS — I10 Essential (primary) hypertension: Secondary | ICD-10-CM

## 2016-01-16 DIAGNOSIS — F32A Depression, unspecified: Secondary | ICD-10-CM

## 2016-01-16 DIAGNOSIS — M5440 Lumbago with sciatica, unspecified side: Secondary | ICD-10-CM

## 2016-01-16 DIAGNOSIS — F17210 Nicotine dependence, cigarettes, uncomplicated: Secondary | ICD-10-CM

## 2016-01-16 DIAGNOSIS — F329 Major depressive disorder, single episode, unspecified: Secondary | ICD-10-CM

## 2016-01-16 DIAGNOSIS — G8929 Other chronic pain: Secondary | ICD-10-CM

## 2016-01-16 DIAGNOSIS — E119 Type 2 diabetes mellitus without complications: Secondary | ICD-10-CM

## 2016-01-16 MED ORDER — METOPROLOL TARTRATE 50 MG PO TABS: 50.0000 mg | ORAL_TABLET | Freq: Two times a day (BID) | ORAL | 1 refills | Status: DC

## 2016-01-16 MED ORDER — LISINOPRIL 20 MG PO TABS: 20.0000 mg | ORAL_TABLET | Freq: Every day | ORAL | 1 refills | Status: DC

## 2016-01-16 NOTE — Progress Notes (Signed)
BP (!) 144/80 (BP Location: Left Arm, Patient Position: Sitting, Cuff Size: Large)   Pulse 71   Temp 98.1 F (36.7 C) (Other (Comment))   Ht 5' 6.5" (1.689 m)   Wt 254 lb 9.6 oz (115.5 kg)   SpO2 97%   BMI 40.48 kg/m    Subjective:    Patient ID: Timothy Herman, male    DOB: 11/30/1960, 55 y.o.   MRN: [id number]  HPI: Timothy Herman is a 55 y.o. male presenting on 01/16/2016 for Hypertension and Diabetes   HPI   Pt says he is doing okay with metformin  He says he got his cone discount application turned in.  [facility] he says they scheduled him on veteran's day and were closed. He is waiting for them to call him to reschedule.  Relevant past medical, surgical, family and social history reviewed and updated as indicated. Interim medical history since our last visit reviewed. Allergies and medications reviewed and updated.   Current Outpatient Prescriptions:  .  metFORMIN (GLUMETZA) 500 MG (MOD) 24 hr tablet, Take 1 tablet (500 mg total) by mouth daily with breakfast., Disp: 90 tablet, Rfl: 1 .  citalopram (CELEXA) 20 MG tablet, Take 20 mg by mouth daily., Disp: , Rfl:  .  lisinopril (PRINIVIL,ZESTRIL) 20 MG tablet, Take 1 tablet (20 mg total) by mouth daily. (Patient not taking: Reported on 01/16/2016), Disp: 90 tablet, Rfl: 1 .  metoprolol (LOPRESSOR) 50 MG tablet, Take 1 tablet (50 mg total) by mouth 2 (two) times daily. (Patient not taking: Reported on 01/16/2016), Disp: 180 tablet, Rfl: 1 .  traZODone (DESYREL) 100 MG tablet, Take 100 mg by mouth at bedtime., Disp: , Rfl:    Review of Systems  Constitutional: Positive for diaphoresis and fatigue. Negative for appetite change, chills, fever and unexpected weight change.  HENT: Positive for dental problem and sneezing. Negative for congestion, drooling, ear pain, facial swelling, hearing loss, mouth sores, sore throat, trouble swallowing and voice change.   Eyes: Positive for discharge, redness  and itching. Negative for pain and visual disturbance.  Respiratory: Positive for wheezing. Negative for cough, choking and shortness of breath.   Cardiovascular: Negative for chest pain, palpitations and leg swelling.  Gastrointestinal: Negative for abdominal pain, blood in stool, constipation, diarrhea and vomiting.  Endocrine: Negative for cold intolerance, heat intolerance and polydipsia.  Genitourinary: Negative for decreased urine volume, dysuria and hematuria.  Musculoskeletal: Positive for arthralgias, back pain and gait problem.  Skin: Negative for rash.  Allergic/Immunologic: Negative for environmental allergies.  Neurological: Negative for seizures, syncope, light-headedness and headaches.  Hematological: Negative for adenopathy.  Psychiatric/Behavioral: Positive for agitation and dysphoric mood. Negative for suicidal ideas. The patient is nervous/anxious.     Per HPI unless specifically indicated above     Objective:    BP (!) 144/80 (BP Location: Left Arm, Patient Position: Sitting, Cuff Size: Large)   Pulse 71   Temp 98.1 F (36.7 C) (Other (Comment))   Ht 5' 6.5" (1.689 m)   Wt 254 lb 9.6 oz (115.5 kg)   SpO2 97%   BMI 40.48 kg/m   Wt Readings from Last 3 Encounters:  01/16/16 254 lb 9.6 oz (115.5 kg)  12/06/15 268 lb (121.6 kg)  11/08/15 278 lb 9.6 oz (126.4 kg)    Physical Exam  Constitutional: He is oriented to person, place, and time. He appears well-developed and well-nourished.  HENT:  Head: Normocephalic and atraumatic.  Neck: Neck supple.  Cardiovascular:  Normal rate and regular rhythm.   Pulmonary/Chest: Effort normal and breath sounds normal. He has no wheezes.  Abdominal: Soft. Bowel sounds are normal. There is no hepatosplenomegaly. There is no tenderness.  Musculoskeletal: He exhibits no edema.  Lymphadenopathy:    He has no cervical adenopathy.  Neurological: He is alert and oriented to person, place, and time.  Skin: Skin is warm and dry.   Psychiatric: He has a normal mood and affect. His behavior is normal.  Vitals reviewed.       Assessment & Plan:    Encounter Diagnoses  Name Primary?  . Type 2 diabetes mellitus without complication, unspecified long term insulin use status (HCC) Yes  . Depression, unspecified depression type   . Essential hypertension, benign   . Morbid obesity (HCC)   . Chronic bilateral low back pain with sciatica, sciatica laterality unspecified   . Cigarette nicotine dependence without complication   . Special screening for malignant neoplasms, colon     -counsled pt that he should [facility] to [facility] the appointment that he missed -Resent rx for lisinopril and metorpolol to [facility] -pt is On list dm eye exam -Gave iFOBT for colon cancer screening -will have Nurse check orthopedics referal -pt to continue current medications -Follow up in 2 months.  RTO sooner prn  (The duration of this appointment visit was 25 minutes of face-to-face time with the patient.  Greater than 50% of this time was spent in counseling, explanation of diagnosis, planning of further management, and coordination of care.)

## 2016-01-21 ENCOUNTER — Telehealth: Payer: Self-pay

## 2016-01-21 NOTE — Telephone Encounter (Signed)
Talked with client regarding need for transportation for two upcoming [facility]. [facility] and [facility]. [facility] called and transportation arranged. Will follow as needed.

## 2016-03-10 ENCOUNTER — Other Ambulatory Visit: Payer: Self-pay | Admitting: Student

## 2016-03-10 DIAGNOSIS — E119 Type 2 diabetes mellitus without complications: Secondary | ICD-10-CM

## 2016-03-10 DIAGNOSIS — I10 Essential (primary) hypertension: Secondary | ICD-10-CM

## 2016-03-12 LAB — HEMOGLOBIN A1C
Hgb A1c MFr Bld: 5.7 % — ABNORMAL HIGH (ref <5.7)
MEAN PLASMA GLUCOSE: 117 mg/dL

## 2016-03-13 LAB — MICROALBUMIN, URINE: Microalb, Ur: 0.5 mg/dL

## 2016-03-18 ENCOUNTER — Encounter: Payer: Self-pay | Admitting: Physician Assistant

## 2016-03-18 ENCOUNTER — Ambulatory Visit: Payer: Self-pay | Admitting: Physician Assistant

## 2016-03-18 VITALS — BP 124/72 | HR 57 | Temp 97.9°F | Ht 66.5 in | Wt 255.7 lb

## 2016-03-18 DIAGNOSIS — F17210 Nicotine dependence, cigarettes, uncomplicated: Secondary | ICD-10-CM

## 2016-03-18 DIAGNOSIS — E119 Type 2 diabetes mellitus without complications: Secondary | ICD-10-CM

## 2016-03-18 DIAGNOSIS — I10 Essential (primary) hypertension: Secondary | ICD-10-CM

## 2016-03-18 DIAGNOSIS — M5440 Lumbago with sciatica, unspecified side: Secondary | ICD-10-CM

## 2016-03-18 DIAGNOSIS — G8929 Other chronic pain: Secondary | ICD-10-CM

## 2016-03-18 DIAGNOSIS — F32A Depression, unspecified: Secondary | ICD-10-CM

## 2016-03-18 DIAGNOSIS — F329 Major depressive disorder, single episode, unspecified: Secondary | ICD-10-CM

## 2016-03-18 MED ORDER — METOPROLOL TARTRATE 50 MG PO TABS: 50.0000 mg | ORAL_TABLET | Freq: Two times a day (BID) | ORAL | 1 refills | Status: DC

## 2016-03-18 MED ORDER — LISINOPRIL 20 MG PO TABS: 20.0000 mg | ORAL_TABLET | Freq: Every day | ORAL | 1 refills | Status: DC

## 2016-03-18 NOTE — Progress Notes (Signed)
BP 124/72 (BP Location: Left Arm, Patient Position: Sitting, Cuff Size: Large)   Pulse (!) 57   Temp 97.9 F (36.6 C) (Other (Comment))   Ht 5' 6.5" (1.689 m)   Wt 255 lb 11.2 oz (116 kg)   SpO2 97%   BMI 40.65 kg/m    Subjective:    Patient ID: Timothy Herman, male    DOB: Jul 24, 1960, 56 y.o.   MRN: [id number]  HPI: Timothy Herman is a 56 y.o. male presenting on 03/18/2016 for Diabetes and Hypertension   HPI   Pt says he has follow up with [facility] in february or march- he isn't sure which  He says he had his eye exam  He hasn't heard from orthopedics yet and didn't get letter from [facility] discount application  He has a new address- he is at the homeless shelter in [place]  Pt not taking his citalopram and trazadone.  Awaiting shipment from [facility].  Relevant past medical, surgical, family and social history reviewed and updated as indicated. Interim medical history since our last visit reviewed. Allergies and medications reviewed and updated.   Current Outpatient Prescriptions:  .  lisinopril (PRINIVIL,ZESTRIL) 20 MG tablet, Take 1 tablet (20 mg total) by mouth daily., Disp: 90 tablet, Rfl: 1 .  metFORMIN (GLUMETZA) 500 MG (MOD) 24 hr tablet, Take 1 tablet (500 mg total) by mouth daily with breakfast., Disp: 90 tablet, Rfl: 1 .  metoprolol (LOPRESSOR) 50 MG tablet, Take 1 tablet (50 mg total) by mouth 2 (two) times daily., Disp: 180 tablet, Rfl: 1 .  citalopram (CELEXA) 20 MG tablet, Take 20 mg by mouth daily., Disp: , Rfl:  .  traZODone (DESYREL) 100 MG tablet, Take 100 mg by mouth at bedtime., Disp: , Rfl:    Review of Systems  Constitutional: Positive for fatigue. Negative for appetite change, chills, diaphoresis, fever and unexpected weight change.  HENT: Positive for congestion, dental problem, mouth sores and sneezing. Negative for drooling, ear pain, facial swelling, hearing loss, sore throat, trouble swallowing and voice change.   Eyes: Positive for redness and  itching. Negative for pain, discharge and visual disturbance.  Respiratory: Negative for cough, choking, shortness of breath and wheezing.   Cardiovascular: Negative for chest pain, palpitations and leg swelling.  Gastrointestinal: Negative for abdominal pain, blood in stool, constipation, diarrhea and vomiting.  Endocrine: Negative for cold intolerance, heat intolerance and polydipsia.  Genitourinary: Negative for decreased urine volume, dysuria and hematuria.  Musculoskeletal: Positive for arthralgias, back pain and gait problem.  Skin: Negative for rash.  Allergic/Immunologic: Positive for environmental allergies.  Neurological: Positive for headaches. Negative for seizures, syncope and light-headedness.  Hematological: Negative for adenopathy.  Psychiatric/Behavioral: Positive for agitation and dysphoric mood. Negative for suicidal ideas. The patient is nervous/anxious.     Per HPI unless specifically indicated above     Objective:    BP 124/72 (BP Location: Left Arm, Patient Position: Sitting, Cuff Size: Large)   Pulse (!) 57   Temp 97.9 F (36.6 C) (Other (Comment))   Ht 5' 6.5" (1.689 m)   Wt 255 lb 11.2 oz (116 kg)   SpO2 97%   BMI 40.65 kg/m   Wt Readings from Last 3 Encounters:  03/18/16 255 lb 11.2 oz (116 kg)  01/16/16 254 lb 9.6 oz (115.5 kg)  12/06/15 268 lb (121.6 kg)    Physical Exam  Constitutional: He is oriented to person, place, and time. He appears well-developed and well-nourished.  HENT:  Head: Normocephalic and atraumatic.  Neck: Neck supple.  Cardiovascular: Normal rate and regular rhythm.   Pulmonary/Chest: Effort normal and breath sounds normal. He has no wheezes.  Abdominal: Soft. Bowel sounds are normal. There is no hepatosplenomegaly. There is no tenderness.  Musculoskeletal: He exhibits no edema.  Lymphadenopathy:    He has no cervical adenopathy.  Neurological: He is alert and oriented to person, place, and time. Gait (walks slowly with  cane) abnormal.  Skin: Skin is warm and dry.  Psychiatric: He has a normal mood and affect. His behavior is normal.  Vitals reviewed.   Results for orders placed or performed in visit on 03/10/16  Microalbumin, urine  Result Value Ref Range   Microalb, Ur 0.5 Not estab mg/dL  HgB [id number]  Result Value Ref Range   Hgb A1c MFr Bld 5.7 (H) <5.7 %   Mean Plasma Glucose 117 mg/dL      Assessment & Plan:   Encounter Diagnoses  Name Primary?  . Essential hypertension, benign Yes  . Depression, unspecified depression type   . Chronic bilateral low back pain with sciatica, sciatica laterality unspecified   . Morbid obesity (HCC)   . Cigarette nicotine dependence without complication   . Type 2 diabetes mellitus without complication, unspecified long term insulin use status (HCC)       -reviewed labs with pt -discontinue metformin -resent rx lisinopril and metoprolol rx to [facility] -pt to continue with [facility] -counseled smoking cessation -nurse called Morrie SheldonAshley about [facility] discount application- was unable to process as it was incomplete.  Nurse discussed with Mr Bayard BeaverDunnahoo and he will submit a new application.  He will need to do this prior to orthopedics scheduling him for appointment -f/u 3 months. RTO sooner prn

## 2016-04-07 ENCOUNTER — Telehealth: Payer: Self-pay

## 2016-04-07 NOTE — Telephone Encounter (Signed)
Client called needing [facility] transportation for [facility]. [facility] called and transportation arranged for 06/17/16. Client aware of pickup time and client also has questions about getting help with glasses. RN will get information about the [employer] and call client back once that is obtained.

## 2016-04-09 ENCOUNTER — Telehealth: Payer: Self-pay

## 2016-04-09 NOTE — Telephone Encounter (Signed)
RN called client with the number and person to call for [employer] in reference to help obtaining eyeglasses. Client also states that he needs and [facility] transport for [facility] to [facility]. Transportation arranged via [facility].

## 2016-05-13 ENCOUNTER — Encounter: Payer: Self-pay | Admitting: Physician Assistant

## 2016-06-10 ENCOUNTER — Other Ambulatory Visit: Payer: Self-pay

## 2016-06-10 DIAGNOSIS — E119 Type 2 diabetes mellitus without complications: Secondary | ICD-10-CM

## 2016-06-10 DIAGNOSIS — I10 Essential (primary) hypertension: Secondary | ICD-10-CM

## 2016-06-17 ENCOUNTER — Encounter: Payer: Self-pay | Admitting: Physician Assistant

## 2016-06-17 ENCOUNTER — Ambulatory Visit: Payer: Self-pay | Admitting: Physician Assistant

## 2016-06-17 VITALS — BP 154/86 | HR 57 | Temp 97.9°F | Ht 66.5 in | Wt 254.7 lb

## 2016-06-17 DIAGNOSIS — I10 Essential (primary) hypertension: Secondary | ICD-10-CM

## 2016-06-17 DIAGNOSIS — F1010 Alcohol abuse, uncomplicated: Secondary | ICD-10-CM

## 2016-06-17 DIAGNOSIS — R911 Solitary pulmonary nodule: Secondary | ICD-10-CM

## 2016-06-17 DIAGNOSIS — F17210 Nicotine dependence, cigarettes, uncomplicated: Secondary | ICD-10-CM

## 2016-06-17 DIAGNOSIS — R9389 Abnormal findings on diagnostic imaging of other specified body structures: Secondary | ICD-10-CM

## 2016-06-17 DIAGNOSIS — F329 Major depressive disorder, single episode, unspecified: Secondary | ICD-10-CM

## 2016-06-17 DIAGNOSIS — F32A Depression, unspecified: Secondary | ICD-10-CM

## 2016-06-17 NOTE — Progress Notes (Signed)
BP (!) 154/86 (BP Location: Left Arm, Patient Position: Sitting, Cuff Size: Large)   Pulse (!) 57   Temp 97.9 F (36.6 C) (Other (Comment))   Ht 5' 6.5" (1.689 m)   Wt 254 lb 11.2 oz (115.5 kg)   SpO2 95%   BMI 40.49 kg/m    Subjective:    Patient ID: Timothy Herman, male    DOB: Jun 23, 1960, 56 y.o.   MRN: [id number]  HPI: Timothy Herman is a 56 y.o. male presenting on 06/17/2016 for Hypertension (states over a month ago,spent 4 days  [facility] in [place] after fell at home "broke several ribs, was told has a mass in R lung and is very concerned")   HPI  Pt is still going to [facility].  He says he doesn't think it helps.    He says he has physical for disability tomorrow.    Pt says he isn't sleeping b/c he is stressed.    He says he has not resubmitted his [facility] discount application.    Pt has been referred to ortho for back pain.  Pt is going tomorrow to apply for medicaid/medicare.   Pt has not gotten zoloft started.  He says he is planning to start that tomorrow.    He is living at an apartment that he was given by help for the homeless.  He can stay there for at least 2 years.   Pt drank a 40 ounce beer last night.  He says he isn't drinking very often.  He is still smoking as well.   Pt did not get his labs drawn.  He says he didn't have a ride. labs ordered a1c and bmp.    Relevant past medical, surgical, family and social history reviewed and updated as indicated. Interim medical history since our last visit reviewed. Allergies and medications reviewed and updated.   Current Outpatient Prescriptions:  .  citalopram (CELEXA) 20 MG tablet, Take 20 mg by mouth daily., Disp: , Rfl:  .  lisinopril (PRINIVIL,ZESTRIL) 20 MG tablet, Take 1 tablet (20 mg total) by mouth daily., Disp: 90 tablet, Rfl: 1 .  metoprolol (LOPRESSOR) 50 MG tablet, Take 1 tablet (50 mg total) by mouth 2 (two) times daily., Disp: 180 tablet, Rfl: 1 .  traZODone (DESYREL) 100 MG tablet,  Take 100 mg by mouth at bedtime., Disp: , Rfl:  .  sertraline (ZOLOFT) 50 MG tablet, Take 50 mg by mouth daily., Disp: , Rfl:   Review of Systems  Constitutional: Positive for chills, diaphoresis, fatigue and fever. Negative for appetite change and unexpected weight change.  HENT: Positive for congestion, dental problem, drooling and sneezing. Negative for ear pain, facial swelling, hearing loss, mouth sores, sore throat, trouble swallowing and voice change.   Eyes: Positive for pain, discharge, redness, itching and visual disturbance.  Respiratory: Positive for cough, shortness of breath and wheezing. Negative for choking.   Cardiovascular: Positive for chest pain. Negative for palpitations and leg swelling.  Gastrointestinal: Positive for diarrhea. Negative for abdominal pain, blood in stool, constipation and vomiting.  Endocrine: Positive for cold intolerance and polydipsia. Negative for heat intolerance.  Genitourinary: Negative for decreased urine volume, dysuria and hematuria.  Musculoskeletal: Positive for arthralgias, back pain and gait problem.  Skin: Negative for rash.  Allergic/Immunologic: Positive for environmental allergies.  Neurological: Positive for light-headedness. Negative for seizures, syncope and headaches.  Hematological: Negative for adenopathy.  Psychiatric/Behavioral: Positive for agitation and dysphoric mood. Negative for suicidal ideas. The patient is nervous/anxious.  Per HPI unless specifically indicated above     Objective:    BP (!) 154/86 (BP Location: Left Arm, Patient Position: Sitting, Cuff Size: Large)   Pulse (!) 57   Temp 97.9 F (36.6 C) (Other (Comment))   Ht 5' 6.5" (1.689 m)   Wt 254 lb 11.2 oz (115.5 kg)   SpO2 95%   BMI 40.49 kg/m   Wt Readings from Last 3 Encounters:  06/17/16 254 lb 11.2 oz (115.5 kg)  03/18/16 255 lb 11.2 oz (116 kg)  01/16/16 254 lb 9.6 oz (115.5 kg)    Physical Exam  Constitutional: He is oriented to person,  place, and time. He appears well-developed and well-nourished.  HENT:  Head: Normocephalic and atraumatic.  Neck: Neck supple.  Cardiovascular: Normal rate and regular rhythm.   Pulmonary/Chest: Effort normal and breath sounds normal. He has no wheezes.  Abdominal: Soft. Bowel sounds are normal. There is no hepatosplenomegaly. There is no tenderness.  Musculoskeletal: He exhibits no edema.  Lymphadenopathy:    He has no cervical adenopathy.  Neurological: He is alert and oriented to person, place, and time.  Skin: Skin is warm and dry.  Psychiatric: He has a normal mood and affect. His behavior is normal.  Vitals reviewed.       Assessment & Plan:   Encounter Diagnoses  Name Primary?  . Essential hypertension, benign Yes  . Lung nodule, solitary   . Abnormal chest CT   . Depression, unspecified depression type   . Alcohol abuse   . Cigarette nicotine dependence without complication   . Morbid obesity, unspecified obesity type (HCC)     -in the future will try to Call pt sooner to get labs-   Says it takes 4 days to schedule [facility].  -obtained records from [facility].  Appears that pt was not admitted for any reason but was kept in observation to await psych eval as he was talking SI while in the ER.  CT reviewed and discussed with pt that it recommends re-CT in 6 months (which would be mid-august) -urged pt to follow up with [facility] for his depression -pt is given customer service number to call in reference his cone discount application -pt to get labs and follow up here after that.  He says 2 weeks should work.  RTO sooenr prn

## 2016-06-17 NOTE — Patient Instructions (Signed)
[facility] Customer Service [phone]    

## 2016-06-24 ENCOUNTER — Telehealth: Payer: Self-pay

## 2016-06-24 NOTE — Telephone Encounter (Signed)
Client called needed [facility] for upcoming lab work appointment and appointment with the [facility]. [facility] arranged for:  06/27/16 to [facility] labs appointment 0900 pickup time 0800 07/01/16 [facility] appointment at 10:15 am pickup time 0915   Client aware of dates and pickup times for above appointments.  Will follow as needed

## 2016-06-27 LAB — BASIC METABOLIC PANEL
BUN: 8 mg/dL (ref 7–25)
CALCIUM: 9.3 mg/dL (ref 8.6–10.3)
CO2: 26 mmol/L (ref 20–31)
Chloride: 105 mmol/L (ref 98–110)
Creat: 0.91 mg/dL (ref 0.70–1.33)
GLUCOSE: 106 mg/dL — AB (ref 65–99)
POTASSIUM: 4.7 mmol/L (ref 3.5–5.3)
SODIUM: 139 mmol/L (ref 135–146)

## 2016-06-28 LAB — HEMOGLOBIN A1C
HEMOGLOBIN A1C: 5.4 % (ref <5.7)
Mean Plasma Glucose: 108 mg/dL

## 2016-07-01 ENCOUNTER — Encounter: Payer: Self-pay | Admitting: Physician Assistant

## 2016-07-01 ENCOUNTER — Ambulatory Visit: Payer: Self-pay | Admitting: Physician Assistant

## 2016-07-01 VITALS — BP 150/86 | HR 62 | Temp 98.1°F | Ht 66.5 in | Wt 253.0 lb

## 2016-07-01 DIAGNOSIS — I10 Essential (primary) hypertension: Secondary | ICD-10-CM

## 2016-07-01 DIAGNOSIS — G8929 Other chronic pain: Secondary | ICD-10-CM

## 2016-07-01 DIAGNOSIS — M5440 Lumbago with sciatica, unspecified side: Secondary | ICD-10-CM

## 2016-07-01 MED ORDER — HYDROCHLOROTHIAZIDE 12.5 MG PO CAPS: 12.5000 mg | ORAL_CAPSULE | Freq: Every day | ORAL | 0 refills | Status: DC

## 2016-07-01 NOTE — Progress Notes (Signed)
BP (!) 150/86 (BP Location: Left Arm, Patient Position: Sitting, Cuff Size: Normal)   Pulse 62   Temp 98.1 F (36.7 C)   Ht 5' 6.5" (1.689 m)   Wt 253 lb (114.8 kg)   SpO2 98%   BMI 40.22 kg/m    Subjective:    Patient ID: Timothy Herman, male    DOB: Dec 28, 1960, 56 y.o.   MRN: [id number]  HPI: Timothy Herman is a 56 y.o. male presenting on 07/01/2016 for Follow-up   HPI Pt says he applied for medicare and medicaid and thinks he will be getting both.  He says he will hear any time now.  Pt has been referred to orthopedics but it hasn't been scheduled yet due to he hasn't turned in [facility] discount application  Pt has appt in July with [facility].  Pt states knee gave out and he fell last Thursday.  He got abrasion on forehead but no LOC   pt drank a glass of beer last night.  He says he isn't drinking every day  Pt says he called in to [facility] about 5 days ago for the lisinopril  Relevant past medical, surgical, family and social history reviewed and updated as indicated. Interim medical history since our last visit reviewed. Allergies and medications reviewed and updated.   Current Outpatient Prescriptions:  .  citalopram (CELEXA) 20 MG tablet, Take 20 mg by mouth daily., Disp: , Rfl:  .  lisinopril (PRINIVIL,ZESTRIL) 20 MG tablet, Take 1 tablet (20 mg total) by mouth daily., Disp: 90 tablet, Rfl: 1 .  metoprolol (LOPRESSOR) 50 MG tablet, Take 1 tablet (50 mg total) by mouth 2 (two) times daily., Disp: 180 tablet, Rfl: 1 .  sertraline (ZOLOFT) 50 MG tablet, Take 50 mg by mouth daily., Disp: , Rfl:  .  traZODone (DESYREL) 100 MG tablet, Take 100 mg by mouth at bedtime., Disp: , Rfl:    Review of Systems  Constitutional: Positive for fatigue. Negative for appetite change, chills, diaphoresis, fever and unexpected weight change.  HENT: Positive for congestion, dental problem and sneezing. Negative for drooling, ear pain, facial swelling, hearing loss, mouth sores, sore throat,  trouble swallowing and voice change.   Eyes: Positive for discharge and itching. Negative for pain, redness and visual disturbance.  Respiratory: Positive for cough, shortness of breath and wheezing. Negative for choking.   Cardiovascular: Negative for chest pain, palpitations and leg swelling.  Gastrointestinal: Negative for abdominal pain, blood in stool, constipation, diarrhea and vomiting.  Endocrine: Positive for polydipsia. Negative for cold intolerance and heat intolerance.  Genitourinary: Negative for decreased urine volume, dysuria and hematuria.  Musculoskeletal: Positive for arthralgias, back pain and gait problem.  Skin: Negative for rash.  Allergic/Immunologic: Positive for environmental allergies.  Neurological: Negative for seizures, syncope, light-headedness and headaches.  Hematological: Negative for adenopathy.  Psychiatric/Behavioral: Positive for dysphoric mood. Negative for agitation and suicidal ideas. The patient is nervous/anxious.     Per HPI unless specifically indicated above     Objective:    BP (!) 150/86 (BP Location: Left Arm, Patient Position: Sitting, Cuff Size: Normal)   Pulse 62   Temp 98.1 F (36.7 C)   Ht 5' 6.5" (1.689 m)   Wt 253 lb (114.8 kg)   SpO2 98%   BMI 40.22 kg/m   Wt Readings from Last 3 Encounters:  07/01/16 253 lb (114.8 kg)  06/17/16 254 lb 11.2 oz (115.5 kg)  03/18/16 255 lb 11.2 oz (116 kg)    Physical Exam  Constitutional: He is oriented to person, place, and time. He appears well-developed and well-nourished.  HENT:  Head: Normocephalic and atraumatic.  Neck: Neck supple.  Cardiovascular: Normal rate and regular rhythm.   Pulmonary/Chest: Effort normal and breath sounds normal. He has no wheezes.  Abdominal: Soft. Bowel sounds are normal. There is no hepatosplenomegaly. There is no tenderness.  Musculoskeletal: He exhibits no edema.  Lymphadenopathy:    He has no cervical adenopathy.  Neurological: He is alert and  oriented to person, place, and time.  Skin: Skin is warm and dry.  Psychiatric: He has a normal mood and affect. His behavior is normal.  Vitals reviewed.   Results for orders placed or performed in visit on 06/10/16  Basic Metabolic Panel (BMET)  Result Value Ref Range   Sodium 139 135 - 146 mmol/L   Potassium 4.7 3.5 - 5.3 mmol/L   Chloride 105 98 - 110 mmol/L   CO2 26 20 - 31 mmol/L   Glucose, Bld 106 (H) 65 - 99 mg/dL   BUN 8 7 - 25 mg/dL   Creat 1.61 0.96 - 0.45 mg/dL   Calcium 9.3 8.6 - 40.9 mg/dL  Hemoglobin W1X  Result Value Ref Range   Hgb A1c MFr Bld 5.4 <5.7 %   Mean Plasma Glucose 108 mg/dL      Assessment & Plan:   Encounter Diagnoses  Name Primary?  . Essential hypertension, benign Yes  . Chronic bilateral low back pain with sciatica, sciatica laterality unspecified     -add hctz 12.5mg  for blood pressure -Reviewed labs with pt.  He is no longer diabetic -referral to orthopedics pending medicaid/[facility] discount -pt to follow up one month to recheck the blood pressure

## 2016-07-29 ENCOUNTER — Telehealth: Payer: Self-pay

## 2016-07-29 NOTE — Telephone Encounter (Signed)
Client needing [facility] transportation for 08/05/16 at 1030 am and pickup time at 0930 am to [facility] [place]. Client aware of pickup time.

## 2016-08-05 ENCOUNTER — Ambulatory Visit: Payer: Self-pay | Admitting: Physician Assistant

## 2016-08-06 ENCOUNTER — Telehealth: Payer: Self-pay

## 2016-08-06 ENCOUNTER — Encounter: Payer: Self-pay | Admitting: Physician Assistant

## 2016-08-06 NOTE — Telephone Encounter (Signed)
Client needing [facility] transportation for an appointment [facility]. RN arranged transportation for 08/13/16 at 1130 am. Client aware pickup time of 1030 am.  Will follow as needed.

## 2016-08-13 ENCOUNTER — Encounter: Payer: Self-pay | Admitting: Physician Assistant

## 2016-08-13 ENCOUNTER — Ambulatory Visit: Payer: Self-pay | Admitting: Physician Assistant

## 2016-08-13 VITALS — BP 130/80 | HR 53 | Temp 97.9°F | Ht 66.5 in | Wt 265.0 lb

## 2016-08-13 DIAGNOSIS — G8929 Other chronic pain: Secondary | ICD-10-CM

## 2016-08-13 DIAGNOSIS — M5440 Lumbago with sciatica, unspecified side: Secondary | ICD-10-CM

## 2016-08-13 DIAGNOSIS — I10 Essential (primary) hypertension: Secondary | ICD-10-CM

## 2016-08-13 NOTE — Progress Notes (Signed)
BP 130/80 (BP Location: Left Arm, Patient Position: Sitting, Cuff Size: Large)   Pulse (!) 53   Temp 97.9 F (36.6 C) (Other (Comment))   Ht 5' 6.5" (1.689 m)   Wt 265 lb (120.2 kg)   SpO2 96%   BMI 42.13 kg/m    Subjective:    Patient ID: Timothy Herman, male    DOB: June 16, 1960, 56 y.o.   MRN: [id number]  HPI: Timothy Herman is a 56 y.o. male presenting on 08/13/2016 for Hypertension (states would like to go somewhere in [place] for counseling, anywhere but [facility])   HPI   Pt says he still doesn't know about his medicare/medicaid. He says he hasn't heard anything.    He got help from financial counselor at [facility] filling out his medicaid application  Pt has appointment with [facility] July 10.   Discussed with pt that options limited with him having no insurance.  Recommended that he go there now and when he gets medicaid, he can see about finding another provider.  He agrees.   Pt says he is the same.  Relevant past medical, surgical, family and social history reviewed and updated as indicated. Interim medical history since our last visit reviewed. Allergies and medications reviewed and updated.   Current Outpatient Prescriptions:  .  hydrochlorothiazide (MICROZIDE) 12.5 MG capsule, Take 1 capsule (12.5 mg total) by mouth daily., Disp: 90 capsule, Rfl: 0 .  lisinopril (PRINIVIL,ZESTRIL) 20 MG tablet, Take 1 tablet (20 mg total) by mouth daily., Disp: 90 tablet, Rfl: 1 .  metoprolol (LOPRESSOR) 50 MG tablet, Take 1 tablet (50 mg total) by mouth 2 (two) times daily., Disp: 180 tablet, Rfl: 1   Review of Systems  Constitutional: Positive for fatigue. Negative for appetite change, chills, diaphoresis, fever and unexpected weight change.  HENT: Positive for dental problem and sneezing. Negative for congestion, drooling, ear pain, facial swelling, hearing loss, mouth sores, sore throat, trouble swallowing and voice change.   Eyes: Positive for discharge and itching. Negative for pain,  redness and visual disturbance.  Respiratory: Positive for wheezing. Negative for cough, choking and shortness of breath.   Cardiovascular: Negative for chest pain, palpitations and leg swelling.  Gastrointestinal: Negative for abdominal pain, blood in stool, constipation, diarrhea and vomiting.  Endocrine: Positive for polydipsia. Negative for cold intolerance and heat intolerance.  Genitourinary: Negative for decreased urine volume, dysuria and hematuria.  Musculoskeletal: Positive for arthralgias, back pain and gait problem.  Skin: Negative for rash.  Allergic/Immunologic: Negative for environmental allergies.  Neurological: Negative for seizures, syncope, light-headedness and headaches.  Hematological: Negative for adenopathy.  Psychiatric/Behavioral: Positive for agitation and dysphoric mood. Negative for suicidal ideas. The patient is nervous/anxious.     Per HPI unless specifically indicated above     Objective:    BP 130/80 (BP Location: Left Arm, Patient Position: Sitting, Cuff Size: Large)   Pulse (!) 53   Temp 97.9 F (36.6 C) (Other (Comment))   Ht 5' 6.5" (1.689 m)   Wt 265 lb (120.2 kg)   SpO2 96%   BMI 42.13 kg/m   Wt Readings from Last 3 Encounters:  08/13/16 265 lb (120.2 kg)  07/01/16 253 lb (114.8 kg)  06/17/16 254 lb 11.2 oz (115.5 kg)    Physical Exam  Constitutional: He is oriented to person, place, and time. He appears well-developed and well-nourished.  HENT:  Head: Normocephalic and atraumatic.  Neck: Neck supple.  Cardiovascular: Normal rate and regular rhythm.   Pulmonary/Chest: Effort normal and  breath sounds normal. He has no wheezes.  Abdominal: Soft. Bowel sounds are normal. There is no hepatosplenomegaly. There is no tenderness.  Musculoskeletal: He exhibits no edema.  Pt hobbling along slowly due to hip pain  Lymphadenopathy:    He has no cervical adenopathy.  Neurological: He is alert and oriented to person, place, and time.  Skin: Skin  is warm and dry.  Psychiatric: He has a normal mood and affect. His behavior is normal.  Vitals reviewed.       Assessment & Plan:   Encounter Diagnoses  Name Primary?  . Essential hypertension, benign Yes  . Chronic bilateral low back pain with sciatica, sciatica laterality unspecified     -Pt to Call to check on status about medicaid -Pt needs to get to ortho- have been trying to get him there since September 2017 -F/u 1 month to recheck status again. RTO sooner prn

## 2016-08-13 NOTE — Patient Instructions (Addendum)
[employer] Counselor- [phone]  

## 2016-09-04 ENCOUNTER — Telehealth: Payer: Self-pay

## 2016-09-04 NOTE — Telephone Encounter (Signed)
Called Client, Client in need of [facility] transportation for an [facility] at 8 am.  [facility] arranged and client notified .

## 2016-10-02 ENCOUNTER — Other Ambulatory Visit: Payer: Self-pay | Admitting: Physician Assistant

## 2016-10-06 ENCOUNTER — Other Ambulatory Visit: Payer: Self-pay | Admitting: Physician Assistant

## 2016-10-06 MED ORDER — METOPROLOL TARTRATE 50 MG PO TABS: 50.0000 mg | ORAL_TABLET | Freq: Two times a day (BID) | ORAL | 0 refills | Status: DC

## 2016-10-06 MED ORDER — LISINOPRIL 20 MG PO TABS: 20.0000 mg | ORAL_TABLET | Freq: Every day | ORAL | 0 refills | Status: DC

## 2016-11-10 ENCOUNTER — Telehealth: Payer: Self-pay

## 2016-11-10 NOTE — Telephone Encounter (Signed)
[facility] arranged for appointment to the [facility] on 11/13/16 and client aware of pickup time of 0900.  Will follow as needed

## 2016-11-13 ENCOUNTER — Encounter: Payer: Self-pay | Admitting: Physician Assistant

## 2016-11-13 ENCOUNTER — Ambulatory Visit: Payer: Self-pay | Admitting: Physician Assistant

## 2016-11-13 VITALS — BP 130/70 | HR 69 | Temp 97.9°F | Ht 66.5 in | Wt 275.5 lb

## 2016-11-13 DIAGNOSIS — Z125 Encounter for screening for malignant neoplasm of prostate: Secondary | ICD-10-CM

## 2016-11-13 DIAGNOSIS — R911 Solitary pulmonary nodule: Secondary | ICD-10-CM

## 2016-11-13 DIAGNOSIS — F17210 Nicotine dependence, cigarettes, uncomplicated: Secondary | ICD-10-CM

## 2016-11-13 DIAGNOSIS — G8929 Other chronic pain: Secondary | ICD-10-CM

## 2016-11-13 DIAGNOSIS — M5440 Lumbago with sciatica, unspecified side: Secondary | ICD-10-CM

## 2016-11-13 DIAGNOSIS — F329 Major depressive disorder, single episode, unspecified: Secondary | ICD-10-CM

## 2016-11-13 DIAGNOSIS — I10 Essential (primary) hypertension: Secondary | ICD-10-CM

## 2016-11-13 DIAGNOSIS — F32A Depression, unspecified: Secondary | ICD-10-CM

## 2016-11-13 NOTE — Patient Instructions (Signed)
Financial Counselor- [phone] 

## 2016-11-13 NOTE — Progress Notes (Signed)
BP 130/70 (BP Location: Left Arm, Patient Position: Sitting, Cuff Size: Large)   Pulse 69   Temp 97.9 F (36.6 C)   Ht 5' 6.5" (1.689 m)   Wt 275 lb 8 oz (125 kg)   SpO2 98%   BMI 43.80 kg/m    Subjective:    Patient ID: Timothy Herman, male    DOB: 10/10/1960, 56 y.o.   MRN: [id number]  HPI: Timothy Herman is a 56 y.o. male presenting on 11/13/2016 for Hypertension   HPI   Pt has still not heard on his medicaid.    Pt did not contact financial counselor at [facility] as recommended at last OV.  Pt has been referred to orthopedics but hasn't been seen due to the above  Pt is going to [facility] now.  He says it is helping  Relevant past medical, surgical, family and social history reviewed and updated as indicated. Interim medical history since our last visit reviewed. Allergies and medications reviewed and updated.   Current Outpatient Prescriptions:  .  citalopram (CELEXA) 20 MG tablet, Take 20 mg by mouth daily., Disp: , Rfl:  .  hydrochlorothiazide (HYDRODIURIL) 25 MG tablet, TAKE 1/2 Tablet BY MOUTH ONCE DAILY, Disp: 45 tablet, Rfl: 0 .  lisinopril (PRINIVIL,ZESTRIL) 20 MG tablet, Take 1 tablet (20 mg total) by mouth daily., Disp: 90 tablet, Rfl: 0 .  metoprolol tartrate (LOPRESSOR) 50 MG tablet, Take 1 tablet (50 mg total) by mouth 2 (two) times daily., Disp: 180 tablet, Rfl: 0 .  traZODone (DESYREL) 100 MG tablet, Take 100 mg by mouth at bedtime., Disp: , Rfl:    Review of Systems  Constitutional: Positive for fatigue. Negative for appetite change, chills, diaphoresis, fever and unexpected weight change.  HENT: Positive for congestion, dental problem and sneezing. Negative for drooling, ear pain, facial swelling, hearing loss, mouth sores, sore throat, trouble swallowing and voice change.   Eyes: Positive for redness and visual disturbance. Negative for pain, discharge and itching.  Respiratory: Positive for wheezing. Negative for cough, choking and shortness of breath.    Cardiovascular: Negative for chest pain, palpitations and leg swelling.  Gastrointestinal: Negative for abdominal pain, blood in stool, constipation, diarrhea and vomiting.  Endocrine: Negative for cold intolerance, heat intolerance and polydipsia.  Genitourinary: Negative for decreased urine volume, dysuria and hematuria.  Musculoskeletal: Negative for arthralgias, back pain and gait problem.  Skin: Negative for rash.  Allergic/Immunologic: Negative for environmental allergies.  Neurological: Negative for seizures, syncope, light-headedness and headaches.  Hematological: Negative for adenopathy.  Psychiatric/Behavioral: Negative for agitation, dysphoric mood and suicidal ideas. The patient is not nervous/anxious.     Per HPI unless specifically indicated above     Objective:    BP 130/70 (BP Location: Left Arm, Patient Position: Sitting, Cuff Size: Large)   Pulse 69   Temp 97.9 F (36.6 C)   Ht 5' 6.5" (1.689 m)   Wt 275 lb 8 oz (125 kg)   SpO2 98%   BMI 43.80 kg/m   Wt Readings from Last 3 Encounters:  11/13/16 275 lb 8 oz (125 kg)  08/13/16 265 lb (120.2 kg)  07/01/16 253 lb (114.8 kg)    Physical Exam  Constitutional: He is oriented to person, place, and time. He appears well-developed and well-nourished.  HENT:  Head: Normocephalic and atraumatic.  Neck: Neck supple.  Cardiovascular: Normal rate and regular rhythm.   Pulmonary/Chest: Effort normal and breath sounds normal. He has no wheezes.  Abdominal: Soft. Bowel sounds are normal.  There is no hepatosplenomegaly. There is no tenderness.  Musculoskeletal: He exhibits no edema.  Lymphadenopathy:    He has no cervical adenopathy.  Neurological: He is alert and oriented to person, place, and time. Gait abnormal.  Skin: Skin is warm and dry.  Psychiatric: He has a normal mood and affect. His behavior is normal.  Vitals reviewed.       Assessment & Plan:   Encounter Diagnoses  Name Primary?  . Essential  hypertension, benign Yes  . Screening for prostate cancer   . Depression, unspecified depression type   . Morbid obesity (HCC)   . Cigarette nicotine dependence without complication   . Lung nodule, solitary   . Chronic bilateral low back pain with sciatica, sciatica laterality unspecified      -pt to continue with [facility] for mH issues -pt to continue with current medications -will Check bmp and psa.  We will call with results -pt awaiting orthopedic appointment (waiting on medicaid or [facility] discount) -pt will need repeat CT chest to follow up on lung nodule in January or February 2019 -pt to follow up in 4 months.  RTO sooner prn

## 2016-11-18 ENCOUNTER — Other Ambulatory Visit: Payer: Self-pay | Admitting: Physician Assistant

## 2016-11-19 ENCOUNTER — Telehealth: Payer: Self-pay

## 2016-11-19 NOTE — Telephone Encounter (Signed)
Client called needing help with transportation for labs to be drawn at [facility] on 11/24/16.   [facility] called with client on phone and arranged for transportation, pickup will be 0800 on 11/24/16 for 0900 appointment.   Client aware.   Will follow as needed.

## 2016-11-24 ENCOUNTER — Other Ambulatory Visit (HOSPITAL_COMMUNITY)
Admission: RE | Admit: 2016-11-24 | Discharge: 2016-11-24 | Disposition: A | Discharge status: Home / Self Care | Payer: Self-pay | Source: Ambulatory Visit | Attending: Physician Assistant | Admitting: Physician Assistant

## 2016-11-24 DIAGNOSIS — I10 Essential (primary) hypertension: Secondary | ICD-10-CM | POA: Insufficient documentation

## 2016-11-24 DIAGNOSIS — Z125 Encounter for screening for malignant neoplasm of prostate: Secondary | ICD-10-CM | POA: Insufficient documentation

## 2016-11-24 LAB — BASIC METABOLIC PANEL
Anion gap: 11 (ref 5–15)
BUN: 10 mg/dL (ref 6–20)
CHLORIDE: 98 mmol/L — AB (ref 101–111)
CO2: 26 mmol/L (ref 22–32)
CREATININE: 0.82 mg/dL (ref 0.61–1.24)
Calcium: 9.3 mg/dL (ref 8.9–10.3)
GFR calc Af Amer: >60 mL/min (ref >60)
GFR calc non Af Amer: >60 mL/min (ref >60)
Glucose, Bld: 126 mg/dL — ABNORMAL HIGH (ref 65–99)
POTASSIUM: 3.9 mmol/L (ref 3.5–5.1)
SODIUM: 135 mmol/L (ref 135–145)

## 2016-11-24 LAB — PSA: Prostatic Specific Antigen: 0.6 ng/mL (ref 0.00–4.00)

## 2016-12-22 ENCOUNTER — Telehealth: Payer: Self-pay

## 2016-12-22 NOTE — Telephone Encounter (Signed)
RN arranged [facility] for client's appointment on 12/29/16 at 10 am.  [facility] will pickup client at 0900. Client aware of pickup time.  No further needs at this time, client states he is awaiting a letter coming soon that may allow him to see a "back specialist". Will continue to follow as needed.

## 2017-03-10 ENCOUNTER — Telehealth: Payer: Self-pay

## 2017-03-10 NOTE — Telephone Encounter (Signed)
Client called requesting Transportation assistance to [facility]  [facility] arranged and pickup time will be at 0845. Client aware.  Client states his back and legs are sore and he is [facility] regarding this pain. RN encouraged client to discuss this possible referral again with staff and provider at his upcoming appointment. He states he will do so. No further needs at present.    Timothy NodalPatricia Herman Timothy Mellinger RN  [place] [facility]  [phone]

## 2017-03-16 ENCOUNTER — Telehealth: Payer: Self-pay

## 2017-03-16 ENCOUNTER — Ambulatory Visit: Payer: Self-pay | Admitting: Physician Assistant

## 2017-03-16 NOTE — Telephone Encounter (Signed)
Client requesting [facility] transportation in order to go [facility] at 10:15 am. Pickup time 0915. Client unavailable, voicemail left for client with information.  Will follow as needed.

## 2017-03-24 ENCOUNTER — Ambulatory Visit: Payer: Self-pay | Admitting: Physician Assistant

## 2017-03-24 ENCOUNTER — Encounter: Payer: Self-pay | Admitting: Physician Assistant

## 2017-03-24 VITALS — BP 116/70 | HR 70 | Temp 97.3°F | Ht 66.5 in | Wt 266.0 lb

## 2017-03-24 DIAGNOSIS — R918 Other nonspecific abnormal finding of lung field: Secondary | ICD-10-CM

## 2017-03-24 DIAGNOSIS — G8929 Other chronic pain: Secondary | ICD-10-CM

## 2017-03-24 DIAGNOSIS — F32A Depression, unspecified: Secondary | ICD-10-CM

## 2017-03-24 DIAGNOSIS — M5441 Lumbago with sciatica, right side: Secondary | ICD-10-CM

## 2017-03-24 DIAGNOSIS — F17210 Nicotine dependence, cigarettes, uncomplicated: Secondary | ICD-10-CM

## 2017-03-24 DIAGNOSIS — R9389 Abnormal findings on diagnostic imaging of other specified body structures: Secondary | ICD-10-CM

## 2017-03-24 DIAGNOSIS — M549 Dorsalgia, unspecified: Secondary | ICD-10-CM

## 2017-03-24 DIAGNOSIS — M5440 Lumbago with sciatica, unspecified side: Secondary | ICD-10-CM

## 2017-03-24 DIAGNOSIS — I10 Essential (primary) hypertension: Secondary | ICD-10-CM

## 2017-03-24 DIAGNOSIS — F329 Major depressive disorder, single episode, unspecified: Secondary | ICD-10-CM

## 2017-03-24 DIAGNOSIS — R911 Solitary pulmonary nodule: Secondary | ICD-10-CM

## 2017-03-24 NOTE — Progress Notes (Signed)
BP 116/70 (BP Location: Left Arm, Patient Position: Sitting, Cuff Size: Large)   Pulse 70   Temp (!) 97.3 F (36.3 C) (Other (Comment))   Ht 5' 6.5" (1.689 m)   Wt 266 lb (120.7 kg)   SpO2 97%   BMI 42.29 kg/m    Subjective:    Patient ID: Timothy Herman, male    DOB: 21-Jun-1960, 57 y.o.   MRN: [id number]  HPI: Timothy Herman is a 57 y.o. male presenting on 03/24/2017 for Hypertension (states now has [facility] discount approval)   HPI   Pt says he got denied for medicaid.  He says he got approved for [facility] discount.   He still hasn't been scheduled with orthopedics.   referral was done 11/08/2015 so it will need to be reordered.  Imaging was also ordered at that time that pt did not get done.  He is still going to [facility].   Pt is also due for CT chest to follow up on lung nodule (scan done at [facility]. Report scanned in Epic)  Pt is still smoking  Relevant past medical, surgical, family and social history reviewed and updated as indicated. Interim medical history since our last visit reviewed. Allergies and medications reviewed and updated.   Current Outpatient Medications:  .  hydrochlorothiazide (HYDRODIURIL) 25 MG tablet, TAKE 1/2 Tablet BY MOUTH ONCE DAILY, Disp: 45 tablet, Rfl: 3 .  lisinopril (PRINIVIL,ZESTRIL) 20 MG tablet, TAKE 1 Tablet BY MOUTH ONCE DAILY, Disp: 90 tablet, Rfl: 3 .  metoprolol tartrate (LOPRESSOR) 50 MG tablet, TAKE 1 Tablet  BY MOUTH TWICE DAILY, Disp: 180 tablet, Rfl: 2 .  traZODone (DESYREL) 100 MG tablet, Take 100 mg by mouth at bedtime., Disp: , Rfl:  .  citalopram (CELEXA) 20 MG tablet, Take 20 mg by mouth daily., Disp: , Rfl:    Review of Systems  Constitutional: Positive for appetite change and fatigue. Negative for chills, diaphoresis, fever and unexpected weight change.  HENT: Positive for dental problem and sneezing. Negative for congestion, drooling, ear pain, facial swelling, hearing loss, mouth sores, sore throat, trouble swallowing  and voice change.   Eyes: Positive for discharge and itching. Negative for pain, redness and visual disturbance.  Respiratory: Positive for shortness of breath and wheezing. Negative for cough and choking.   Cardiovascular: Negative for chest pain, palpitations and leg swelling.  Gastrointestinal: Negative for abdominal pain, blood in stool, constipation, diarrhea and vomiting.  Endocrine: Negative for cold intolerance, heat intolerance and polydipsia.  Genitourinary: Negative for decreased urine volume, dysuria and hematuria.  Musculoskeletal: Positive for arthralgias, back pain and gait problem.  Skin: Negative for rash.  Allergic/Immunologic: Positive for environmental allergies.  Neurological: Negative for seizures, syncope, light-headedness and headaches.  Hematological: Negative for adenopathy.  Psychiatric/Behavioral: Positive for agitation and dysphoric mood. Negative for suicidal ideas. The patient is nervous/anxious.     Per HPI unless specifically indicated above     Objective:    BP 116/70 (BP Location: Left Arm, Patient Position: Sitting, Cuff Size: Large)   Pulse 70   Temp (!) 97.3 F (36.3 C) (Other (Comment))   Ht 5' 6.5" (1.689 m)   Wt 266 lb (120.7 kg)   SpO2 97%   BMI 42.29 kg/m   Wt Readings from Last 3 Encounters:  03/24/17 266 lb (120.7 kg)  11/13/16 275 lb 8 oz (125 kg)  08/13/16 265 lb (120.2 kg)    Physical Exam  Constitutional: He is oriented to person, place, and time. He appears  well-developed and well-nourished.  HENT:  Head: Normocephalic and atraumatic.  Neck: Neck supple.  Cardiovascular: Normal rate and regular rhythm.  Pulmonary/Chest: Effort normal and breath sounds normal. He has no wheezes.  Abdominal: Soft. Bowel sounds are normal. There is no hepatosplenomegaly. There is no tenderness.  Musculoskeletal: He exhibits no edema.  Lymphadenopathy:    He has no cervical adenopathy.  Neurological: He is alert and oriented to person, place,  and time.  Skin: Skin is warm and dry.  Psychiatric: He has a normal mood and affect. His behavior is normal.  Vitals reviewed.       Assessment & Plan:    Encounter Diagnoses  Name Primary?  . Essential hypertension, benign Yes  . Abnormal chest CT   . Lung nodule, solitary   . Abnormal findings on diagnostic imaging of lung   . Chronic back pain, unspecified back location, unspecified back pain laterality   . Cigarette nicotine dependence without complication   . Depression, unspecified depression type   . Chronic bilateral low back pain with sciatica, sciatica laterality unspecified   . Morbid obesity (HCC)      -pt to get Bmp today. We will call him with results -we will Reenter orthopedics referral -will reorder follow up  CT chest to evaluate nodule -pt to Continue current meds -[facility] -pt to follow up 4 months.  RTO sooner prn

## 2017-03-30 ENCOUNTER — Telehealth: Payer: Self-pay

## 2017-03-30 NOTE — Telephone Encounter (Signed)
Client requesting [facility] transportation assistance for 04/03/17 appointment for a CT scan and also for lab work that day at [facility]. [facility] arranged to have client to [facility] at 1300. Pickup time will be 12 noon. Client aware of pickup times. No further needs at this time.  Will follow as needed.

## 2017-04-03 ENCOUNTER — Ambulatory Visit (HOSPITAL_COMMUNITY)
Admission: RE | Admit: 2017-04-03 | Discharge: 2017-04-03 | Disposition: A | Discharge status: Home / Self Care | Payer: Medicaid Other | Source: Ambulatory Visit | Attending: Physician Assistant | Admitting: Physician Assistant

## 2017-04-03 ENCOUNTER — Other Ambulatory Visit (HOSPITAL_COMMUNITY)
Admission: RE | Admit: 2017-04-03 | Discharge: 2017-04-03 | Disposition: A | Discharge status: Home / Self Care | Payer: Self-pay | Source: Ambulatory Visit | Attending: Physician Assistant | Admitting: Physician Assistant

## 2017-04-03 DIAGNOSIS — I2510 Atherosclerotic heart disease of native coronary artery without angina pectoris: Secondary | ICD-10-CM | POA: Diagnosis not present

## 2017-04-03 DIAGNOSIS — I700 Atherosclerosis of aorta: Secondary | ICD-10-CM | POA: Insufficient documentation

## 2017-04-03 DIAGNOSIS — R918 Other nonspecific abnormal finding of lung field: Secondary | ICD-10-CM | POA: Insufficient documentation

## 2017-04-03 DIAGNOSIS — I10 Essential (primary) hypertension: Secondary | ICD-10-CM | POA: Insufficient documentation

## 2017-04-03 LAB — BASIC METABOLIC PANEL
Anion gap: 10 (ref 5–15)
BUN: 15 mg/dL (ref 6–20)
CALCIUM: 9.4 mg/dL (ref 8.9–10.3)
CHLORIDE: 99 mmol/L — AB (ref 101–111)
CO2: 27 mmol/L (ref 22–32)
CREATININE: 0.86 mg/dL (ref 0.61–1.24)
Glucose, Bld: 102 mg/dL — ABNORMAL HIGH (ref 65–99)
Potassium: 4.4 mmol/L (ref 3.5–5.1)
SODIUM: 136 mmol/L (ref 135–145)

## 2017-04-13 ENCOUNTER — Telehealth: Payer: Self-pay | Admitting: Orthopaedic Surgery

## 2017-04-13 NOTE — Telephone Encounter (Signed)
Referral has been sent to our office by patient's primary care at [facility] for problem as follows: (does not appear patient had Xray or other imaging) Chronic bilateral low back pain with sciatica, sciatica laterality unspecified  - please review and advise.

## 2017-04-15 ENCOUNTER — Telehealth: Payer: Self-pay

## 2017-04-15 NOTE — Telephone Encounter (Signed)
I am out of town for 10 weeks.  Not able to see now.

## 2017-04-15 NOTE — Telephone Encounter (Signed)
Client called requesting [facility] transportation for 04/21/17 at 0920 to [facility]. Transportation arranged and client notified regarding pickup time of 0820 on the 04/21/17.  No further needs at this time.  Will follow as needed.

## 2017-04-20 NOTE — Telephone Encounter (Signed)
Called back to patient per Dr Sanjuan DameKeeling's response; left voice message. Also noted in workqueue referral.

## 2017-04-23 ENCOUNTER — Other Ambulatory Visit: Payer: Self-pay | Admitting: Physician Assistant

## 2017-04-23 ENCOUNTER — Telehealth: Payer: Self-pay

## 2017-04-23 DIAGNOSIS — M549 Dorsalgia, unspecified: Principal | ICD-10-CM

## 2017-04-23 DIAGNOSIS — G8929 Other chronic pain: Secondary | ICD-10-CM

## 2017-04-23 NOTE — Telephone Encounter (Signed)
Client called needing [facility] for an x-[facility] that [facility] prior to his Orthopaedic appointment.   [facility] arranged for 04/30/17 for appointment at 0900. Daryll Drownickup will be for 0800. Client aware of times.  Will follow up as needed.

## 2017-04-30 ENCOUNTER — Ambulatory Visit (HOSPITAL_COMMUNITY)
Admission: RE | Admit: 2017-04-30 | Discharge: 2017-04-30 | Disposition: A | Discharge status: Home / Self Care | Payer: Medicaid Other | Source: Ambulatory Visit | Attending: Physician Assistant | Admitting: Physician Assistant

## 2017-04-30 DIAGNOSIS — M47816 Spondylosis without myelopathy or radiculopathy, lumbar region: Secondary | ICD-10-CM | POA: Insufficient documentation

## 2017-04-30 DIAGNOSIS — M549 Dorsalgia, unspecified: Secondary | ICD-10-CM | POA: Insufficient documentation

## 2017-04-30 DIAGNOSIS — G8929 Other chronic pain: Secondary | ICD-10-CM | POA: Insufficient documentation

## 2017-05-13 ENCOUNTER — Telehealth: Payer: Self-pay

## 2017-05-13 NOTE — Telephone Encounter (Signed)
Contacted client to inform him of a work role change and to give him numbers of [facility] RNs that can now assist him with transportation and other needs to his medical and mental health appointments.   Samara DeistKathryn RN at [facility] Mondays from 9 am to 12 noon in [place] . Work # [phone]  Or  Kara MeadEmma RN at [facility] Wednesdays from 9 am to 12 noon in [place]  Work # [phone]   Client states understanding.   Francee NodalPatricia R Lytle Malburg RN  [facility]  [phone]

## 2017-07-16 ENCOUNTER — Ambulatory Visit: Payer: Self-pay | Admitting: Orthopaedic Surgery

## 2017-07-22 ENCOUNTER — Ambulatory Visit: Payer: Self-pay | Admitting: Physician Assistant

## 2017-07-23 ENCOUNTER — Ambulatory Visit: Payer: Self-pay | Admitting: Physician Assistant

## 2017-07-23 ENCOUNTER — Encounter: Payer: Self-pay | Admitting: Physician Assistant

## 2017-07-23 VITALS — BP 118/79 | HR 65 | Temp 97.7°F | Ht 66.5 in | Wt 269.0 lb

## 2017-07-23 DIAGNOSIS — I10 Essential (primary) hypertension: Secondary | ICD-10-CM

## 2017-07-23 DIAGNOSIS — G8929 Other chronic pain: Secondary | ICD-10-CM

## 2017-07-23 DIAGNOSIS — F17219 Nicotine dependence, cigarettes, with unspecified nicotine-induced disorders: Secondary | ICD-10-CM

## 2017-07-23 DIAGNOSIS — M549 Dorsalgia, unspecified: Secondary | ICD-10-CM

## 2017-07-23 DIAGNOSIS — Z13220 Encounter for screening for lipoid disorders: Secondary | ICD-10-CM

## 2017-07-23 DIAGNOSIS — Z125 Encounter for screening for malignant neoplasm of prostate: Secondary | ICD-10-CM

## 2017-07-23 NOTE — Progress Notes (Signed)
BP 118/79 (BP Location: Left Arm, Patient Position: Sitting, Cuff Size: Normal)   Pulse 65   Temp 97.7 F (36.5 C)   Ht 5' 6.5" (1.689 m)   Wt 269 lb (122 kg)   SpO2 97%   BMI 42.77 kg/m    Subjective:    Patient ID: Timothy Herman, male    DOB: 1960-09-05, 57 y.o.   MRN: [id number]  HPI: Timothy Herman is a 57 y.o. male presenting on 07/23/2017 for Hypertension   HPI   Pt says he has not been approved for [employer] care.  He has been referred to orthopedics  He continues to see Floydene Flock  He is still smoking  Pt says he is only rarely drinking now.   Relevant past medical, surgical, family and social history reviewed and updated as indicated. Interim medical history since our last visit reviewed. Allergies and medications reviewed and updated.   Current Outpatient Medications:  .  hydrochlorothiazide (HYDRODIURIL) 25 MG tablet, TAKE 1/2 Tablet BY MOUTH ONCE DAILY, Disp: 45 tablet, Rfl: 3 .  lisinopril (PRINIVIL,ZESTRIL) 20 MG tablet, TAKE 1 Tablet BY MOUTH ONCE DAILY, Disp: 90 tablet, Rfl: 3 .  metoprolol tartrate (LOPRESSOR) 50 MG tablet, TAKE 1 Tablet  BY MOUTH TWICE DAILY, Disp: 180 tablet, Rfl: 2 .  traZODone (DESYREL) 100 MG tablet, Take 100 mg by mouth at bedtime., Disp: , Rfl:  .  citalopram (CELEXA) 20 MG tablet, Take 20 mg by mouth daily., Disp: , Rfl:   Review of Systems  Constitutional: Positive for fatigue. Negative for appetite change, chills, diaphoresis, fever and unexpected weight change.  HENT: Positive for congestion, dental problem and sneezing. Negative for drooling, ear pain, facial swelling, hearing loss, mouth sores, sore throat, trouble swallowing and voice change.   Eyes: Positive for discharge, redness and itching. Negative for pain and visual disturbance.  Respiratory: Positive for cough, shortness of breath and wheezing. Negative for choking.   Cardiovascular: Negative for chest pain, palpitations and leg swelling.  Gastrointestinal: Negative  for abdominal pain, blood in stool, constipation, diarrhea and vomiting.  Endocrine: Negative for cold intolerance, heat intolerance and polydipsia.  Genitourinary: Negative for decreased urine volume, dysuria and hematuria.  Musculoskeletal: Positive for arthralgias, back pain and gait problem.  Skin: Negative for rash.  Allergic/Immunologic: Negative for environmental allergies.  Neurological: Negative for seizures, syncope, light-headedness and headaches.  Hematological: Negative for adenopathy.  Psychiatric/Behavioral: Positive for agitation and dysphoric mood. Negative for suicidal ideas. The patient is nervous/anxious.     Per HPI unless specifically indicated above     Objective:    BP 118/79 (BP Location: Left Arm, Patient Position: Sitting, Cuff Size: Normal)   Pulse 65   Temp 97.7 F (36.5 C)   Ht 5' 6.5" (1.689 m)   Wt 269 lb (122 kg)   SpO2 97%   BMI 42.77 kg/m   Wt Readings from Last 3 Encounters:  07/23/17 269 lb (122 kg)  03/24/17 266 lb (120.7 kg)  11/13/16 275 lb 8 oz (125 kg)    Physical Exam  Constitutional: He is oriented to person, place, and time. He appears well-developed and well-nourished.  HENT:  Head: Normocephalic and atraumatic.  Neck: Neck supple.  Cardiovascular: Normal rate and regular rhythm.  Pulmonary/Chest: Effort normal and breath sounds normal. He has no wheezes.  Abdominal: Soft. Bowel sounds are normal. There is no hepatosplenomegaly. There is no tenderness.  Musculoskeletal: He exhibits no edema.  Lymphadenopathy:    He has no cervical adenopathy.  Neurological: He is alert and oriented to person, place, and time.  Skin: Skin is warm and dry.  Psychiatric: He has a normal mood and affect. His behavior is normal.  Vitals reviewed.       Assessment & Plan:   Encounter Diagnoses  Name Primary?  . Essential hypertension, benign Yes  . Screening for prostate cancer   . Screening cholesterol level   . Cigarette nicotine  dependence with nicotine-induced disorder   . Chronic back pain, unspecified back location, unspecified back pain laterality   . Morbid obesity (HCC)      -pt to continue current medications -pt to get fasting labs.  Will call with results -pt to get [employer] application completed so he can get to orthopedics -counseled smoking cessation -pt to continue with [facility] for MH issues -pt to follow up here 4 months.  RTO sooner prn

## 2017-08-10 ENCOUNTER — Other Ambulatory Visit (HOSPITAL_COMMUNITY)
Admission: RE | Admit: 2017-08-10 | Discharge: 2017-08-10 | Disposition: A | Discharge status: Home / Self Care | Payer: Medicaid Other | Source: Ambulatory Visit | Attending: Physician Assistant | Admitting: Physician Assistant

## 2017-08-10 DIAGNOSIS — Z125 Encounter for screening for malignant neoplasm of prostate: Secondary | ICD-10-CM

## 2017-08-10 DIAGNOSIS — I10 Essential (primary) hypertension: Secondary | ICD-10-CM | POA: Insufficient documentation

## 2017-08-10 DIAGNOSIS — Z13220 Encounter for screening for lipoid disorders: Secondary | ICD-10-CM | POA: Insufficient documentation

## 2017-08-10 DIAGNOSIS — R69 Illness, unspecified: Secondary | ICD-10-CM | POA: Diagnosis present

## 2017-08-10 LAB — COMPREHENSIVE METABOLIC PANEL
ALBUMIN: 4.1 g/dL (ref 3.5–5.0)
ALT: 160 U/L — AB (ref 17–63)
AST: 24 U/L (ref 15–41)
Alkaline Phosphatase: 67 U/L (ref 38–126)
Anion gap: 10 (ref 5–15)
BUN: 13 mg/dL (ref 6–20)
CHLORIDE: 100 mmol/L — AB (ref 101–111)
CO2: 26 mmol/L (ref 22–32)
Calcium: 9 mg/dL (ref 8.9–10.3)
Creatinine, Ser: 0.82 mg/dL (ref 0.61–1.24)
GFR calc Af Amer: >60 mL/min (ref >60)
Glucose, Bld: 130 mg/dL — ABNORMAL HIGH (ref 65–99)
Potassium: 4 mmol/L (ref 3.5–5.1)
SODIUM: 136 mmol/L (ref 135–145)
Total Bilirubin: 0.8 mg/dL (ref 0.3–1.2)
Total Protein: 7.6 g/dL (ref 6.5–8.1)

## 2017-08-10 LAB — LIPID PANEL
Cholesterol: 178 mg/dL (ref 0–200)
HDL: 34 mg/dL — ABNORMAL LOW (ref >40)
LDL CALC: 121 mg/dL — AB (ref 0–99)
TRIGLYCERIDES: 116 mg/dL (ref <150)
Total CHOL/HDL Ratio: 5.2 RATIO
VLDL: 23 mg/dL (ref 0–40)

## 2017-08-10 LAB — PSA: Prostatic Specific Antigen: 0.3 ng/mL (ref 0.00–4.00)

## 2017-08-11 ENCOUNTER — Other Ambulatory Visit: Payer: Self-pay | Admitting: Physician Assistant

## 2017-08-11 DIAGNOSIS — I10 Essential (primary) hypertension: Secondary | ICD-10-CM

## 2017-08-11 DIAGNOSIS — E785 Hyperlipidemia, unspecified: Secondary | ICD-10-CM

## 2017-08-11 DIAGNOSIS — R7989 Other specified abnormal findings of blood chemistry: Secondary | ICD-10-CM

## 2017-08-11 DIAGNOSIS — R945 Abnormal results of liver function studies: Secondary | ICD-10-CM

## 2017-10-07 ENCOUNTER — Other Ambulatory Visit: Payer: Self-pay | Admitting: Physician Assistant

## 2017-11-16 ENCOUNTER — Other Ambulatory Visit: Payer: Self-pay | Admitting: Physician Assistant

## 2017-11-16 MED ORDER — METOPROLOL TARTRATE 50 MG PO TABS: 50.0000 mg | ORAL_TABLET | Freq: Two times a day (BID) | ORAL | 0 refills | Status: AC

## 2017-11-16 MED ORDER — LISINOPRIL 20 MG PO TABS: 20.0000 mg | ORAL_TABLET | Freq: Every day | ORAL | 0 refills | Status: AC

## 2017-11-16 MED ORDER — HYDROCHLOROTHIAZIDE 25 MG PO TABS: 12.5000 mg | ORAL_TABLET | Freq: Every day | ORAL | 0 refills | Status: AC

## 2017-11-23 ENCOUNTER — Ambulatory Visit: Payer: Self-pay | Admitting: Physician Assistant

## 2018-05-30 IMAGING — DX DG LUMBAR SPINE COMPLETE 4+V
5 series · 5 of 5 positions shown · non-contrast
Comparison: Lumbar spine series of August 25, 2014.

CLINICAL DATA: Chronic low back pain radiating into both legs for
the past 2 years. History of a fall 2 years ago.

EXAM:
LUMBAR SPINE - COMPLETE 4+ VIEW

[l-spine ap]
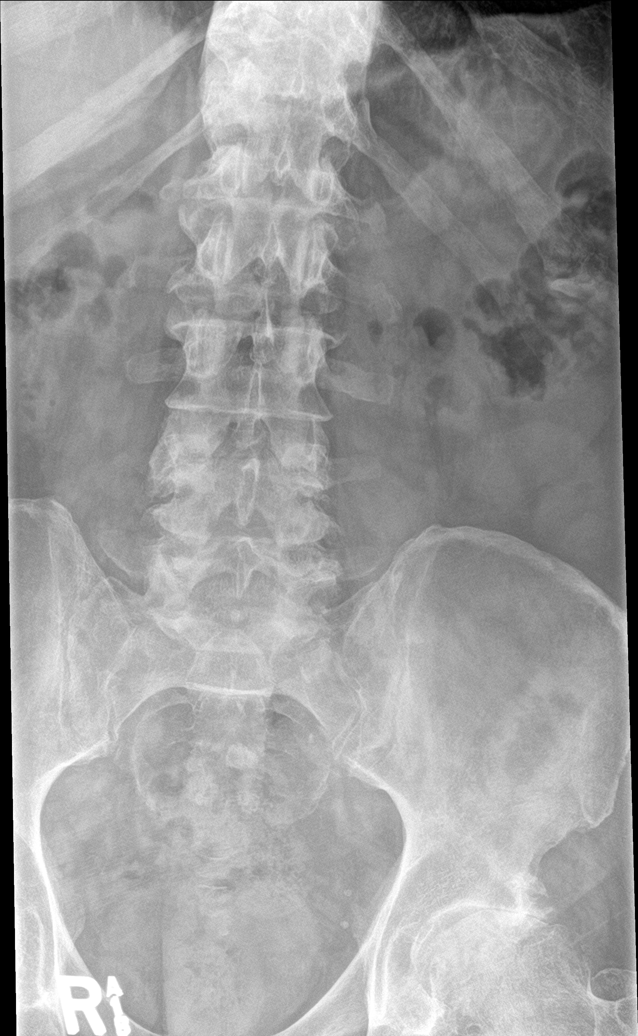

[l-spine obl (1 of 2)]
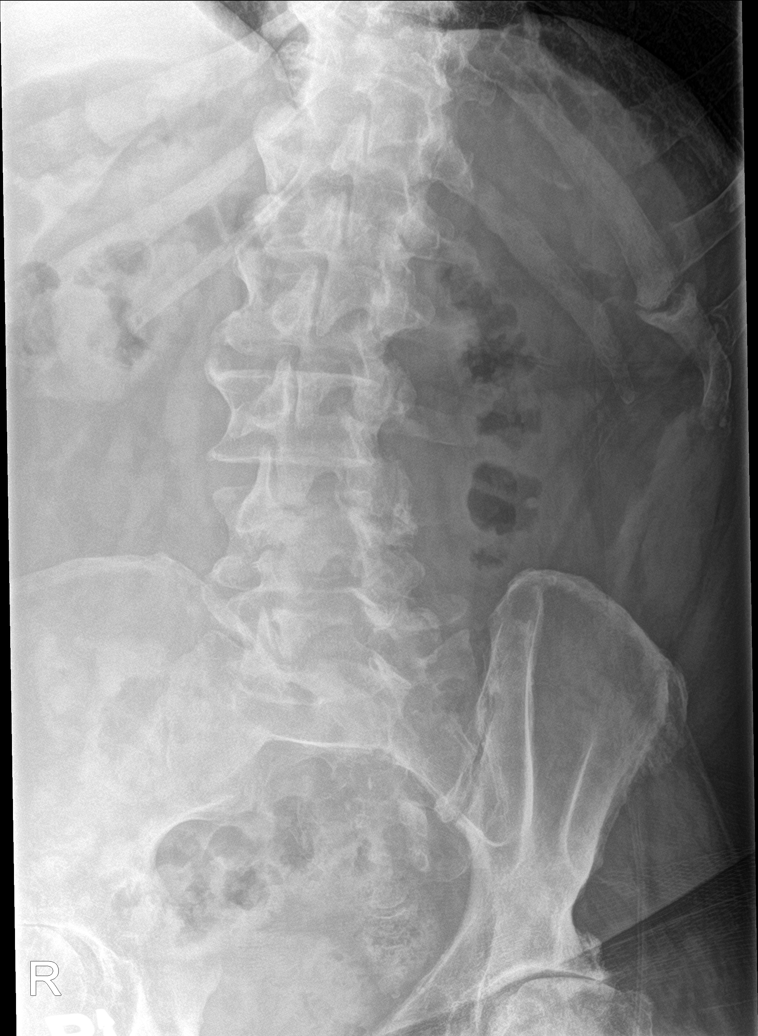

[l-spine obl (2 of 2)]
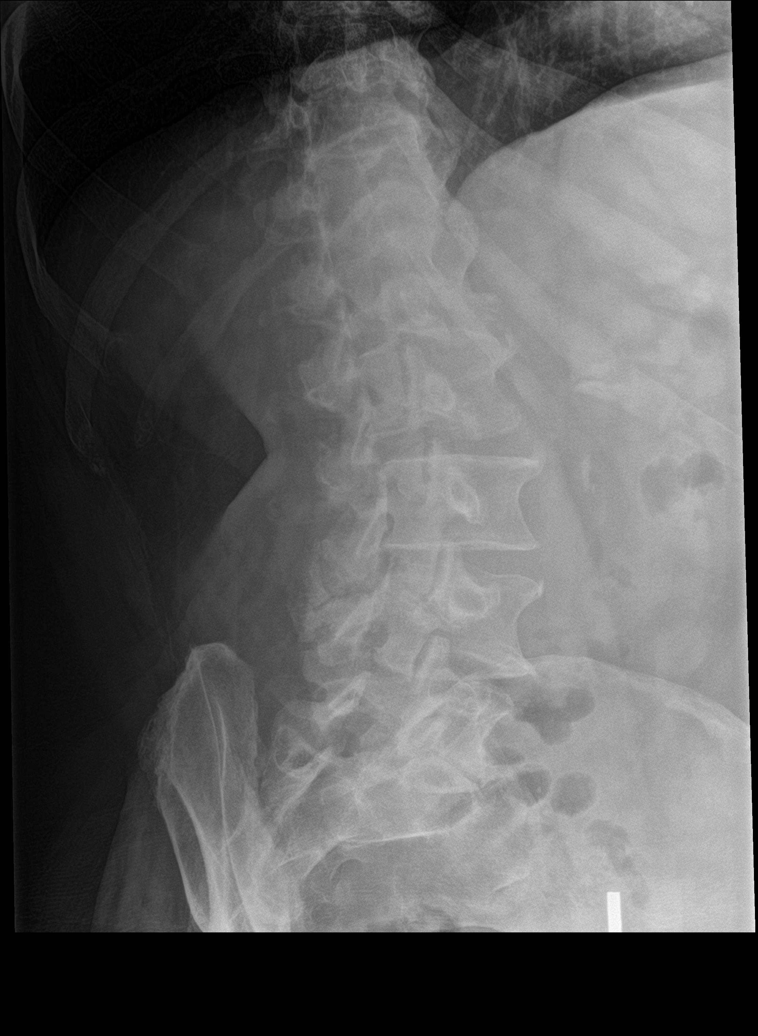

[l-spine lat]
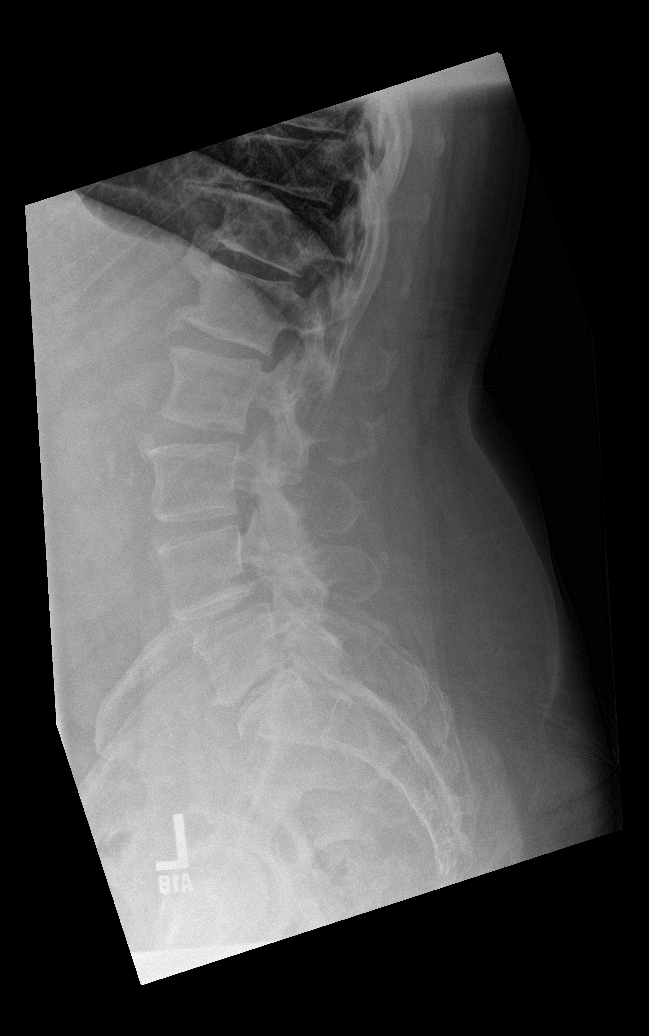

[l-spine spot]
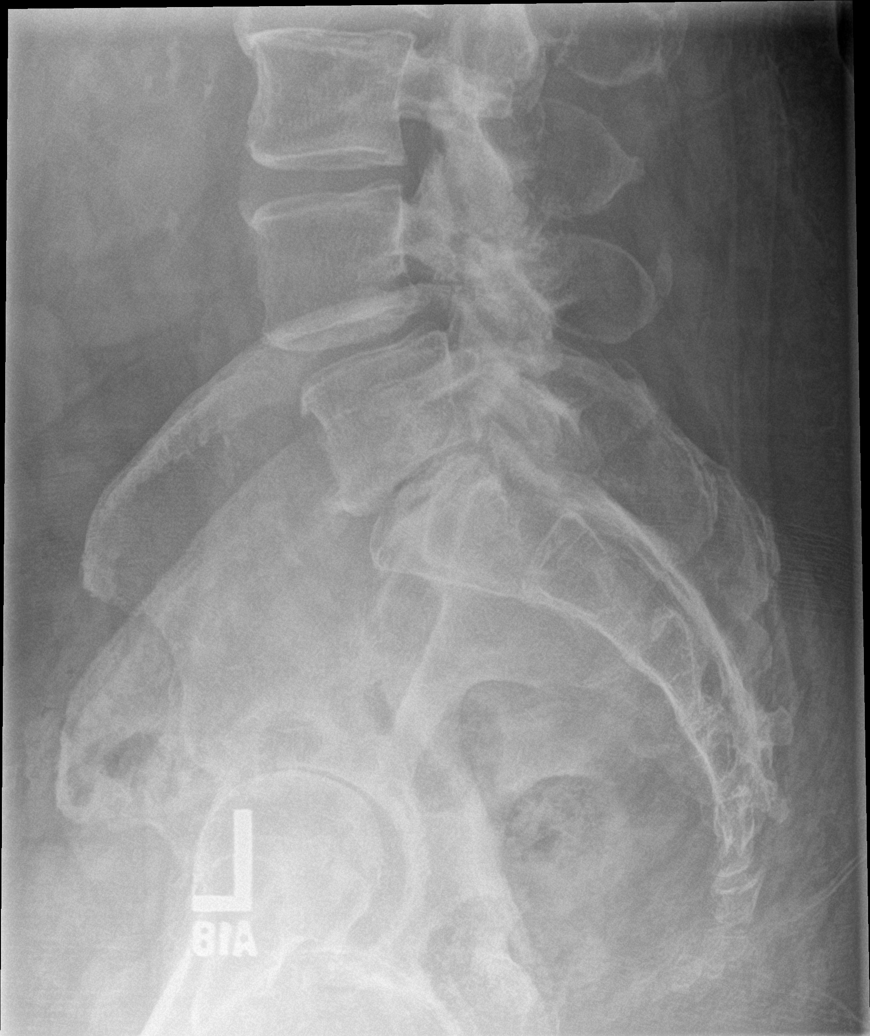

[5 of 5 positions shown; findings below may reference images not displayed]

FINDINGS: The lumbar vertebral bodies are preserved in height. There is
minimal grade 1 anterolisthesis of L4 with respect L5. There is mild
disc space narrowing at this level as well. There is moderate disc
space narrowing at L5-S1. There is facet joint hypertrophy from L3-4
through L5-S1. The pedicles and transverse processes are intact. The
observed portions of the sacrum are normal.
IMPRESSION: There is no acute compression fracture. There is minimal grade 1
anterolisthesis of L4 with respect L5 more conspicuous than on the
previous study. There is degenerative change at the L4-5 and L5-S1
disc levels and at the L3-4 through L5-S1 facet joints.

## 2022-08-19 LAB — COLOGUARD: COLOGUARD: POSITIVE — AB
